# Patient Record
Sex: Female | Born: 1975 | Race: White | Hispanic: No | Marital: Married | Smoking: Never smoker
Health system: Southern US, Community
[De-identification: ages and names within clinical notes are randomized; demographics above are authoritative.]

## PROBLEM LIST (undated history)

## (undated) DIAGNOSIS — F329 Major depressive disorder, single episode, unspecified: Secondary | ICD-10-CM

## (undated) DIAGNOSIS — K219 Gastro-esophageal reflux disease without esophagitis: Secondary | ICD-10-CM

## (undated) DIAGNOSIS — H209 Unspecified iridocyclitis: Secondary | ICD-10-CM

## (undated) DIAGNOSIS — R51 Headache: Secondary | ICD-10-CM

## (undated) DIAGNOSIS — H469 Unspecified optic neuritis: Secondary | ICD-10-CM

## (undated) DIAGNOSIS — F3289 Other specified depressive episodes: Secondary | ICD-10-CM

## (undated) DIAGNOSIS — J309 Allergic rhinitis, unspecified: Secondary | ICD-10-CM

## (undated) HISTORY — DX: Unspecified iridocyclitis: H20.9

## (undated) HISTORY — DX: Allergic rhinitis, unspecified: J30.9

## (undated) HISTORY — DX: Headache: R51

## (undated) HISTORY — DX: Other specified depressive episodes: F32.89

## (undated) HISTORY — DX: Gastro-esophageal reflux disease without esophagitis: K21.9

## (undated) HISTORY — DX: Major depressive disorder, single episode, unspecified: F32.9

## (undated) HISTORY — DX: Unspecified optic neuritis: H46.9

---
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
## 2004-03-21 ENCOUNTER — Encounter: Payer: Self-pay | Admitting: Internal Medicine

## 2004-03-21 ENCOUNTER — Encounter: Admission: RE | Admit: 2004-03-21 | Discharge: 2004-03-21 | Payer: Self-pay | Admitting: Emergency Medicine

## 2004-08-17 ENCOUNTER — Encounter: Admission: RE | Admit: 2004-08-17 | Discharge: 2004-08-17 | Payer: Self-pay | Admitting: Emergency Medicine

## 2004-08-17 ENCOUNTER — Encounter: Payer: Self-pay | Admitting: Internal Medicine

## 2004-10-09 ENCOUNTER — Encounter: Payer: Self-pay | Admitting: Internal Medicine

## 2004-10-09 ENCOUNTER — Emergency Department (HOSPITAL_COMMUNITY): Admission: EM | Admit: 2004-10-09 | Discharge: 2004-10-09 | Payer: Self-pay | Admitting: Emergency Medicine

## 2005-02-12 ENCOUNTER — Ambulatory Visit (HOSPITAL_COMMUNITY): Admission: RE | Admit: 2005-02-12 | Discharge: 2005-02-12 | Payer: Self-pay | Admitting: Gastroenterology

## 2005-02-12 ENCOUNTER — Encounter (INDEPENDENT_AMBULATORY_CARE_PROVIDER_SITE_OTHER): Payer: Self-pay | Admitting: *Deleted

## 2005-05-08 ENCOUNTER — Encounter: Admission: RE | Admit: 2005-05-08 | Discharge: 2005-05-08 | Payer: Self-pay | Admitting: Emergency Medicine

## 2007-09-04 LAB — CONVERTED CEMR LAB
ALT: 16 units/L
AST: 18 units/L
Albumin: 4.5 g/dL
Alkaline Phosphatase: 68 units/L
BUN: 16 mg/dL
Basophils Relative: 1 %
CO2: 22 meq/L
Calcium: 9.4 mg/dL
Chloride: 105 meq/L
Cholesterol: 164 mg/dL
Creatinine, Ser: 0.87 mg/dL
Eosinophils Relative: 1 %
Glucose, Bld: 93 mg/dL
HCT: 38 %
HDL: 43 mg/dL
Hemoglobin: 13 g/dL
LDL Cholesterol: 111 mg/dL
Lymphocytes, automated: 18 %
MCV: 80 fL
Monocytes Relative: 7 %
Neutrophils Relative %: 73 %
Platelets: 237 10*3/uL
Potassium: 4.1 meq/L
RBC: 4.72 M/uL
RDW: 12.2 %
Sodium: 143 meq/L
TSH: 1.542 microintl units/mL
Total Bilirubin: 0.4 mg/dL
Total Protein: 7.4 g/dL
Triglyceride fasting, serum: 51 mg/dL
WBC: 8.2 10*3/uL

## 2008-04-14 ENCOUNTER — Encounter: Admission: RE | Admit: 2008-04-14 | Discharge: 2008-04-14 | Payer: Self-pay | Admitting: Emergency Medicine

## 2008-06-01 ENCOUNTER — Encounter: Admission: RE | Admit: 2008-06-01 | Discharge: 2008-06-01 | Payer: Self-pay | Admitting: Emergency Medicine

## 2009-01-23 ENCOUNTER — Inpatient Hospital Stay (HOSPITAL_COMMUNITY): Admission: AD | Admit: 2009-01-23 | Discharge: 2009-01-23 | Payer: Self-pay | Admitting: Obstetrics & Gynecology

## 2009-01-26 ENCOUNTER — Inpatient Hospital Stay (HOSPITAL_COMMUNITY): Admission: AD | Admit: 2009-01-26 | Discharge: 2009-01-26 | Payer: Self-pay | Admitting: Obstetrics & Gynecology

## 2009-02-03 ENCOUNTER — Inpatient Hospital Stay (HOSPITAL_COMMUNITY): Admission: AD | Admit: 2009-02-03 | Discharge: 2009-02-03 | Payer: Self-pay | Admitting: Obstetrics and Gynecology

## 2009-03-28 LAB — CONVERTED CEMR LAB: Pap Smear: NEGATIVE

## 2009-08-30 ENCOUNTER — Ambulatory Visit: Payer: Self-pay | Admitting: Internal Medicine

## 2009-08-30 DIAGNOSIS — I10 Essential (primary) hypertension: Secondary | ICD-10-CM | POA: Insufficient documentation

## 2009-08-30 DIAGNOSIS — R51 Headache: Secondary | ICD-10-CM

## 2009-08-30 DIAGNOSIS — K219 Gastro-esophageal reflux disease without esophagitis: Secondary | ICD-10-CM

## 2009-08-30 DIAGNOSIS — R519 Headache, unspecified: Secondary | ICD-10-CM | POA: Insufficient documentation

## 2009-08-30 DIAGNOSIS — J309 Allergic rhinitis, unspecified: Secondary | ICD-10-CM | POA: Insufficient documentation

## 2009-08-30 DIAGNOSIS — F329 Major depressive disorder, single episode, unspecified: Secondary | ICD-10-CM

## 2009-08-30 DIAGNOSIS — J45909 Unspecified asthma, uncomplicated: Secondary | ICD-10-CM | POA: Insufficient documentation

## 2009-09-01 ENCOUNTER — Ambulatory Visit: Payer: Self-pay | Admitting: Internal Medicine

## 2009-09-02 LAB — CONVERTED CEMR LAB
ALT: 14 units/L (ref 0–35)
AST: 17 units/L (ref 0–37)
Albumin: 4 g/dL (ref 3.5–5.2)
Alkaline Phosphatase: 47 units/L (ref 39–117)
BUN: 13 mg/dL (ref 6–23)
Basophils Absolute: 0 10*3/uL (ref 0.0–0.1)
Basophils Relative: 0.5 % (ref 0.0–3.0)
Bilirubin Urine: NEGATIVE
Bilirubin, Direct: 0.1 mg/dL (ref 0.0–0.3)
CO2: 27 meq/L (ref 19–32)
Calcium: 9.1 mg/dL (ref 8.4–10.5)
Chloride: 107 meq/L (ref 96–112)
Cholesterol: 162 mg/dL (ref 0–200)
Creatinine, Ser: 0.7 mg/dL (ref 0.4–1.2)
Eosinophils Absolute: 0.1 10*3/uL (ref 0.0–0.7)
Eosinophils Relative: 1 % (ref 0.0–5.0)
GFR calc non Af Amer: 101.79 mL/min (ref >60)
Glucose, Bld: 98 mg/dL (ref 70–99)
HCT: 37 % (ref 36.0–46.0)
HDL: 44.5 mg/dL (ref >39.00)
Hemoglobin, Urine: NEGATIVE
Hemoglobin: 12.7 g/dL (ref 12.0–15.0)
Ketones, ur: NEGATIVE mg/dL
LDL Cholesterol: 108 mg/dL — ABNORMAL HIGH (ref 0–99)
Leukocytes, UA: NEGATIVE
Lymphocytes Relative: 19.9 % (ref 12.0–46.0)
Lymphs Abs: 1.4 10*3/uL (ref 0.7–4.0)
MCHC: 34.4 g/dL (ref 30.0–36.0)
MCV: 85.4 fL (ref 78.0–100.0)
Monocytes Absolute: 0.5 10*3/uL (ref 0.1–1.0)
Monocytes Relative: 6.9 % (ref 3.0–12.0)
Neutro Abs: 5.2 10*3/uL (ref 1.4–7.7)
Neutrophils Relative %: 71.7 % (ref 43.0–77.0)
Nitrite: NEGATIVE
Platelets: 214 10*3/uL (ref 150.0–400.0)
Potassium: 4.2 meq/L (ref 3.5–5.1)
RBC: 4.34 M/uL (ref 3.87–5.11)
RDW: 12.4 % (ref 11.5–14.6)
Sodium: 140 meq/L (ref 135–145)
Specific Gravity, Urine: >=1.03 (ref 1.000–1.030)
TSH: 2.76 microintl units/mL (ref 0.35–5.50)
Total Bilirubin: 0.7 mg/dL (ref 0.3–1.2)
Total CHOL/HDL Ratio: 4
Total Protein, Urine: NEGATIVE mg/dL
Total Protein: 7 g/dL (ref 6.0–8.3)
Triglycerides: 46 mg/dL (ref 0.0–149.0)
Urine Glucose: NEGATIVE mg/dL
Urobilinogen, UA: 0.2 (ref 0.0–1.0)
VLDL: 9.2 mg/dL (ref 0.0–40.0)
WBC: 7.2 10*3/uL (ref 4.5–10.5)
pH: 5 (ref 5.0–8.0)

## 2009-10-26 ENCOUNTER — Ambulatory Visit: Payer: Self-pay | Admitting: Internal Medicine

## 2009-11-18 ENCOUNTER — Encounter: Payer: Self-pay | Admitting: Internal Medicine

## 2009-11-30 ENCOUNTER — Telehealth: Payer: Self-pay | Admitting: Internal Medicine

## 2009-12-05 ENCOUNTER — Ambulatory Visit: Payer: Self-pay | Admitting: Internal Medicine

## 2009-12-05 DIAGNOSIS — H209 Unspecified iridocyclitis: Secondary | ICD-10-CM | POA: Insufficient documentation

## 2009-12-05 LAB — CONVERTED CEMR LAB
Anti Nuclear Antibody(ANA): NEGATIVE
Basophils Absolute: 0.1 10*3/uL (ref 0.0–0.1)
Basophils Relative: 2.1 % (ref 0.0–3.0)
Eosinophils Absolute: 0.1 10*3/uL (ref 0.0–0.7)
Eosinophils Relative: 1 % (ref 0.0–5.0)
HCT: 36 % (ref 36.0–46.0)
Hemoglobin: 12.5 g/dL (ref 12.0–15.0)
IgE (Immunoglobulin E), Serum: 10.6 intl units/mL (ref 0.0–180.0)
Lymphocytes Relative: 23 % (ref 12.0–46.0)
Lymphs Abs: 1.6 10*3/uL (ref 0.7–4.0)
MCHC: 34.8 g/dL (ref 30.0–36.0)
MCV: 85.1 fL (ref 78.0–100.0)
Monocytes Absolute: 0.4 10*3/uL (ref 0.1–1.0)
Monocytes Relative: 6.1 % (ref 3.0–12.0)
Neutro Abs: 4.7 10*3/uL (ref 1.4–7.7)
Neutrophils Relative %: 67.8 % (ref 43.0–77.0)
Platelets: 173 10*3/uL (ref 150.0–400.0)
RBC: 4.23 M/uL (ref 3.87–5.11)
RDW: 12.6 % (ref 11.5–14.6)
Sed Rate: 6 mm/hr (ref 0–22)
WBC: 6.9 10*3/uL (ref 4.5–10.5)

## 2009-12-14 ENCOUNTER — Telehealth: Payer: Self-pay | Admitting: Internal Medicine

## 2009-12-19 ENCOUNTER — Encounter: Payer: Self-pay | Admitting: Internal Medicine

## 2010-01-03 ENCOUNTER — Telehealth: Payer: Self-pay | Admitting: Internal Medicine

## 2010-01-03 DIAGNOSIS — H469 Unspecified optic neuritis: Secondary | ICD-10-CM | POA: Insufficient documentation

## 2010-01-06 ENCOUNTER — Ambulatory Visit (HOSPITAL_COMMUNITY): Admission: RE | Admit: 2010-01-06 | Discharge: 2010-01-06 | Payer: Self-pay | Admitting: Internal Medicine

## 2010-01-06 ENCOUNTER — Telehealth: Payer: Self-pay | Admitting: Internal Medicine

## 2010-04-20 ENCOUNTER — Encounter: Payer: Self-pay | Admitting: Internal Medicine

## 2010-05-28 ENCOUNTER — Encounter: Payer: Self-pay | Admitting: Emergency Medicine

## 2010-06-06 NOTE — Progress Notes (Signed)
Summary: MRI Results - no MS   Phone Note Call from Patient Call back at [phone]   Caller: Patient Reason for Call: Lab or Test Results Summary of Call: PT had MRI done today. Pt would like copy of report sent to Dr. Eustaquio Maize at [facility] and would like callback with results when received at number listed above  Initial call taken by: Brenton Grills MA,  January 06, 2010 4:34 PM  Follow-up for Phone Call        MRI brain normal - no MS - i will call pt with these results Follow-up by: Newt Lukes MD,  January 10, 2010 7:01 AM  Additional Follow-up for Phone Call Additional follow up Details #1::        Dr Elgie Congo office called requesting copy of MRI results. Printed and faxed to [phone] [facility] Additional Follow-up by: Margaret Pyle, CMA,  January 10, 2010 9:33 AM    Additional Follow-up for Phone Call Additional follow up Details #2::    ok - thanks - i also called and LMOM (pt's cell) re: no MS findings - Newt Lukes MD  January 10, 2010 10:27 AM

## 2010-06-06 NOTE — Progress Notes (Signed)
Summary: Lab results  Phone Note Call from Patient Call back at Work Phone [phone]   Caller: Patient [phone] Summary of Call: Pt called requesting remainer of lab results. Initial call taken by: Margaret Pyle, CMA,  December 14, 2009 11:00 AM  Follow-up for Phone Call        all tests returned negative - copies of results were faxed to dr. Deirdre Evener office on 12/09/09 - thanks Follow-up by: Newt Lukes MD,  December 14, 2009 11:03 AM  Additional Follow-up for Phone Call Additional follow up Details #1::        pt informed Additional Follow-up by: Margaret Pyle, CMA,  December 14, 2009 11:10 AM

## 2010-06-06 NOTE — Assessment & Plan Note (Signed)
Summary: NEW / Ezequiel Essex Natale Milch  #   Vital Signs:  Patient profile:   35 year old female Height:      62.5 inches (158.75 cm) Weight:      159.0 pounds (72.27 kg) BMI:     28.72 O2 Sat:      98 % on Room air Temp:     99.1 degrees F (37.28 degrees C) oral Pulse rate:   78 / minute BP sitting:   112 / 78  (left arm) Cuff size:   regular  Vitals Entered By: Orlan Leavens (August 30, 2009 2:01 PM)  O2 Flow:  Room air CC: New patient Is Patient Diabetic? No Pain Assessment Patient in pain? no        Primary Care Provider:  Newt Lukes  CC:  New patient.  History of Present Illness: new pt to me and our practice, here to est care - prev followed with dr. Lorenz Coaster  also patient is here today for annual physical. Patient feels well and has no complaints.  not fasting but will return for labs in AM  Preventive Screening-Counseling & Management  Alcohol-Tobacco     Alcohol drinks/day: <1     Alcohol Counseling: not indicated; use of alcohol is not excessive or problematic     Smoking Status: never     Tobacco Counseling: not indicated; no tobacco use  Caffeine-Diet-Exercise     Diet Counseling: to improve diet; diet is suboptimal     Does Patient Exercise: yes     Type of exercise: walking/treadmill     Times/week: 5     Exercise Counseling: not indicated; exercise is adequate     Depression Counseling: not indicated; screening negative for depression  Safety-Violence-Falls     Seat Belt Use: yes     Seat Belt Counseling: not indicated; patient wears seat belts     Helmet Counseling: not indicated; patient wears helmet when riding bicycle/motocycle     Firearms in the Home: firearms in the home     Firearm Counseling: not indicated; uses recommended firearm safety measures     Smoke Detectors: yes     Smoke Detector Counseling: n/a     Violence Counseling: not indicated; no violence risk noted     Fall Risk Counseling: not indicated; no significant falls  noted  Clinical Review Panels:  Prevention   Last Pap Smear:  Interpretation/Result:Negative for intraepithelial Lesion or Malignancy.    (03/28/2009)  Immunizations   Last Tetanus Booster:  Historical (12/02/2001)  Lipid Management   Cholesterol:  164 (09/04/2007)   LDL (bad choesterol):  111 (09/04/2007)   HDL (good cholesterol):  43 (09/04/2007)   Triglycerides:  51 (09/04/2007)  CBC   WBC:  8.2 (09/04/2007)   RBC:  4.72 (09/04/2007)   Hgb:  13.0 (09/04/2007)   Hct:  38.0 (09/04/2007)   Platelets:  237 (09/04/2007)   MCV  80 (09/04/2007)   RDW  12.2 (09/04/2007)   PMN:  73 (09/04/2007)   Monos:  7 (09/04/2007)   Eosinophils:  1 (09/04/2007)   Basophil:  1 (09/04/2007)  Complete Metabolic Panel   Glucose:  93 (09/04/2007)   Sodium:  143 (09/04/2007)   Potassium:  4.1 (09/04/2007)   Chloride:  105 (09/04/2007)   CO2:  22 (09/04/2007)   BUN:  16 (09/04/2007)   Creatinine:  0.87 (09/04/2007)   Albumin:  4.5 (09/04/2007)   Total Protein:  7.4 (09/04/2007)   Calcium:  9.4 (09/04/2007)   Total Bili:  0.4 (09/04/2007)   Alk Phos:  68 (09/04/2007)   SGPT (ALT):  16 (09/04/2007)   SGOT (AST):  18 (09/04/2007)   -  Date:  09/04/2007    WBC: 8.2    HGB: 13.0    HCT: 38.0    RBC: 4.72    PLT: 237    MCV: 80    RDW: 12.2    Neutrophil: 73    Lymphs: 18    Monos: 7    Eos: 1    Basophil: 1    BG Random: 93    BUN: 16    Creatinine: 0.87    Sodium: 143    Potassium: 4.1    Chloride: 105    CO2 Total: 22    SGOT (AST): 18    SGPT (ALT): 16    T. Bilirubin: 0.4    Alk Phos: 68    Calcium: 9.4    Total Protein: 7.4    Albumin: 4.5    Cholesterol: 164    LDL: 111    HDL: 43    Triglycerides: 51    TSH: 1.542  Current Medications (verified): 1)  Multivitamins  Tabs (Multiple Vitamin) .... Take 1 By Mouth Qd 2)  Calcium 500 Mg Tabs (Calcium) .... Take 1 By Mouth Qd  Allergies (verified): No Known Drug Allergies  Past History:  Past Medical  History: Allergic rhinitis Asthma Depression GERD Hypertension, hx  MD rooster: gyn - tavvon derm - [place]  Past Surgical History: (L) breast tissue expander (1993) (L) breast impant (1995)  Family History: Family History Breast cancer 1st degree relative <50 (other relative) Family History Diabetes 1st degree relative (parent,grandparent) Family History High cholesterol (parent) Family History Hypertension (parent) Blood clots (grandparent) - pGF expired age 64y d/t clot in heart  mom with DM and CVAx2 in 2010 s/p CEA  Social History: Never Smoked rare alcohol - works as Engineer, 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 I have reviewed all other systems and they were negative.   Physical Exam  General:  alert, well-developed, well-nourished, and cooperative to examination.    Eyes:  vision grossly intact; pupils equal, round and reactive to light.  conjunctiva and lids normal.    Ears:  normal pinnae bilaterally, without erythema, swelling, or tenderness to palpation. TMs clear, without effusion, or cerumen impaction. Hearing grossly normal bilaterally  Mouth:  teeth and gums in good repair; mucous membranes moist, without lesions or ulcers. oropharynx clear without exudate, no erythema.  Neck:  supple, full ROM, no masses, no thyromegaly; no thyroid nodules or tenderness. no JVD or carotid bruits.   Lungs:  normal respiratory effort, no intercostal retractions or use of accessory muscles; normal breath sounds bilaterally - no crackles and no wheezes.    Heart:  normal rate, regular rhythm, no murmur, and no rub. BLE without edema. Abdomen:  soft, non-tender, normal bowel sounds, no distention; no masses and no appreciable hepatomegaly or splenomegaly.   Genitalia:  defer to gyn Msk:  No deformity or scoliosis noted of thoracic  or lumbar spine.   Neurologic:  alert & oriented X3 and cranial nerves II-XII symetrically intact.  strength normal in all extremities, sensation intact to light touch, and gait normal. speech fluent without dysarthria or aphasia; follows commands with good comprehension.  Skin:  recent sunburn, but no other rashes, vesicles, ulcers, or erythema. No nodules or irregularity to palpation.  Psych:  Oriented X3, memory intact for recent and remote, normally interactive, good eye contact, not anxious appearing, not depressed appearing, and not agitated.      Impression & Recommendations:  Problem # 1:  PREVENTIVE HEALTH CARE (ICD-V70.0) Patient has been counseled on age-appropriate routine health concerns for screening and prevention. These are reviewed and up-to-date. Immunizations are up-to-date or declined. Labs ordered and will be reviewed.   Complete Medication List: 1)  Multivitamins Tabs (Multiple vitamin) .... Take 1 by mouth qd 2)  Calcium 500 Mg Tabs (Calcium) .... Take 1 by mouth qd 3)  Lorazepam 1 Mg Tabs (Lorazepam) .... 1/2-1 tab by mouth every 12hours as needed for stress and anxiety  Patient Instructions: 1)  it was good to see you today. 2)  prior history and labs reviewed today -  3)  test(s) ordered today for fasting physical labwork to include CBC, Bmet, LFTs, TSH, cholesterol and UA - your results will be posted on the phone tree for review in 48-72 hours from the time of test completion; call [phone] and enter your 9 digit MRN (listed above on this page, just below your name); if any changes need to be made or there are abnormal results, you will be contacted directly.  4)  refill on generic Ativan as requested today  5)  it is important that you continue to work on losing weight - monitor your diet as you are doing and consume fewer calories such as less carbohydrates (sugar) and less fat.  if referral to nutritionist is desired, let me know and i will make a referral as  needed  6)  Please schedule a follow-up appointment annually for medical physical and labs, may call sooner if problems.  Prescriptions: LORAZEPAM 1 MG TABS (LORAZEPAM) 1/2-1 tab by mouth every 12hours as needed for stress and anxiety  #30 x 3   Entered and Authorized by:   Newt Lukes MD   Signed by:   Newt Lukes MD on 08/30/2009   Method used:   Print then Give to Patient   RxID:   [id number]    Immunization History:  Tetanus/Td Immunization History:    Tetanus/Td:  historical (12/02/2001)  Hepatitis B Immunization History:    Hepatitis B # 1:  historical done @ dr. Lorenz Coaster office (09/16/1998)    Hepatitis B # 2:  historical done @ dr. Lorenz Coaster office (11/18/1998)    Hepatitis B # 3:  historical done @ dr. Lorenz Coaster office (04/04/1999)  MMR Immunization History:    MMR # 1:  historical done @ dr. Lorenz Coaster office (11/24/1993)    Pap Smear  Procedure date:  03/28/2009  Findings:      Interpretation/Result:Negative for intraepithelial Lesion or Malignancy.     MRI EXAM  Procedure date:  03/28/2009  Findings:      Exam Type: MRI of the left shoulder w/o contrast Impression; Subacrominal/subdeltoid brusitis without evidence of underlying cuff pathology   MISC. Report  Procedure date:  08/17/2004  Findings:      Type of Report: (L) breast ultrasound Impression; Normal right ultrasound with saline implant in place.  Negative-BI-RADS 1

## 2010-06-06 NOTE — Assessment & Plan Note (Signed)
Summary: F/U PER DR Leana Springston/LMB   Vital Signs:  Patient profile:   35 year old female Height:      62.5 inches (158.75 cm) Weight:      158.12 pounds (71.87 kg) O2 Sat:      97 % on Room air Temp:     98.9 degrees F (37.17 degrees C) oral Pulse rate:   84 / minute BP sitting:   100 / 62  (left arm) Cuff size:   regular  Vitals Entered By: Orlan Leavens RMA (December 05, 2009 8:59 AM)  O2 Flow:  Room air CC: follow-up visit Is Patient Diabetic? No Pain Assessment Patient in pain? no        Primary Care Provider:  Newt Lukes  CC:  follow-up visit.  History of Present Illness: 10/26/09 OV reviewed: c/o eye pain left side onset yesterday afternoon, progressive pain in eye globe itself and pressure in surrounding tissue - redness and excess tearing on left side but no matting while sleeping last night - no fever - no trauma or recent outdoor work -  +blurring vision this AM from left side - no preceeding ear or sinus symptoms  - no allergy flares or sick contacts no prior hx eye problems seen by optho = dx acute uvetitis - tx for same -  since that time, recurrent episodes - also affecting right side - but more pain and problems in left (initial symptoms) seeing optho for same on ongoing basis -  here for systemic eval -  Current Medications (verified): 1)  Multivitamins  Tabs (Multiple Vitamin) .... Take 1 By Mouth Qd 2)  Calcium 500 Mg Tabs (Calcium) .... Take 1 By Mouth Qd 3)  Lorazepam 1 Mg Tabs (Lorazepam) .... 1/2-1 Tab By Mouth Every 12hours As Needed For Stress and Anxiety 4)  Prednisolone Acetate 1 % Susp (Prednisolone Acetate) .... Use 1 Drop in Both Eyes Three Times A Day 5)  Atropine Sulfate 1 % Soln (Atropine Sulfate) .... 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         also, see HPI above. I have reviewed all other systems and they were negative.   Physical Exam  General:  alert, well-developed, well-nourished, and cooperative to examination.    Lungs:  normal respiratory effort, no intercostal retractions or use of accessory muscles; normal breath sounds bilaterally - no crackles and no wheezes.    Heart:  normal rate, regular rhythm, no murmur, and no rub. BLE without edema. 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 despite prompt optho tx as ongoing - eval for underlying systemic dz - see labs below - syphilisi, TB, sarcoid, SpA, LE, sjogrens reports HIV neg in 2003 and decline repeat test  today (no known risks) f/u depending on these results - ?refer to rhuem as needed  Orders: T-Antinuclear Antib (ANA) [phone]) T-RPR (Syphilis) [phone]) T-Syphilis Antibody, IgM,ELISA (Quantitative) [phone]) T-Interior Allergy Panel ([phone]) T-HLA B27 ([id number]) T-Lyme Disease ([id number]) T-2 View CXR (71020TC) TLB-CBC Platelet - w/Differential (85025-CBCD) TLB-Sedimentation Rate (ESR) (85652-ESR)  Complete Medication List: 1)  Multivitamins Tabs (Multiple vitamin) .... Take 1 by mouth qd 2)  Calcium 500 Mg Tabs (Calcium) .... Take 1 by mouth qd 3)  Lorazepam 1 Mg Tabs (Lorazepam) .... 1/2-1 tab  by mouth every 12hours as needed for stress and anxiety 4)  Prednisolone Acetate 1 % Susp (Prednisolone acetate) .... Use 1 drop in both eyes three times a day 5)  Atropine Sulfate 1 % Soln (Atropine sulfate) .... Use 1 drop at bedtime od  Other Orders: TB Skin Test [phone]) Admin 1st Vaccine (25956)  Patient Instructions: 1)  it was good to see you today. 2)  test(s) ordered today - your results will be called to you  3)  PPD today - return Wed 12/07/09 to read 4)  Please schedule a follow-up appointment as needed if problems (or as previously scheduled)    Immunizations Administered:  PPD Skin Test:    Vaccine Type: PPD    Site: left forearm    Mfr: Sanofi Pasteur    Dose: 0.1 ml    Route: ID    Given by: Orlan Leavens RMA    Exp. Date: 10/02/2011    Lot #: [id number]  PPD Results    Date of reading: 12/07/2009    Results: < 5mm    Interpretation: negative

## 2010-06-06 NOTE — Progress Notes (Signed)
Summary: labs  Phone Note Outgoing Call   Call placed by: Orlan Leavens RMA,  November 30, 2009 11:04 AM Call placed to: Patient Summary of Call: Md recieved fax from Dr. [facility]mer [facility] requesting md to order addtional labs. MD wanted me to contact pt to let her know she will need ov to arrange these testing. Called pt no ansew LMOM RTC Initial call taken by: Orlan Leavens RMA,  November 30, 2009 11:05 AM  Follow-up for Phone Call        Pt return call back schedule appt for [facility] 12/05/09 @ 9:00am Follow-up by: Orlan Leavens RMA,  November 30, 2009 3:18 PM

## 2010-06-06 NOTE — Progress Notes (Signed)
Summary: [facility] - dx optic neuritis, ?MS  Phone Note From Other Clinic   Caller: dr. Eustaquio Maize ([facility] specialist) Call For: doctor Summary of Call: i spoke with dr. Eustaquio Maize, retinal specialist at [facility], who eval pt today for uveitis which has been rresistant to treatment - no specific med dz identified on prior systemic eval as per dr. Elmer Picker and myself mid july - now with evidence marked right side optic neuritis and ongoing uveitis - he is recommending eval for poss MS - i will order MRI brain to eval same as would change implications for med mgmt of optic dz Initial call taken by: Newt Lukes MD,  January 03, 2010 2:15 PM  New Problems: UNSPECIFIED OPTIC NEURITIS (ICD-377.30)   New Problems: UNSPECIFIED OPTIC NEURITIS (ICD-377.30)

## 2010-06-06 NOTE — Consult Note (Signed)
Summary: [name]  [name]   Imported By: Sherian Rein 11/03/2009 15:59:06  _____________________________________________________________________  External Attachment:    Type:   Image     Comment:   External Document

## 2010-06-06 NOTE — Consult Note (Signed)
Summary: [name]  [facility]   Imported By: Sherian Rein 12/28/2009 14:09:07  _____________________________________________________________________  External Attachment:    Type:   Image     Comment:   External Document

## 2010-06-06 NOTE — Letter (Signed)
Summary: [facility]  [facility]   Imported By: Sherian Rein 12/06/2009 11:51:39  _____________________________________________________________________  External Attachment:    Type:   Image     Comment:   External Document

## 2010-06-06 NOTE — Assessment & Plan Note (Signed)
Summary: PAINFUL RED SWOLLEN EYE  STC   Vital Signs:  Patient profile:   35 year old female Height:      62.5 inches (158.75 cm) Weight:      156.8 pounds (71.27 kg) O2 Sat:      98 % on Room air Temp:     99.3 degrees F (37.39 degrees C) oral Pulse rate:   72 / minute BP sitting:   110 / 70  (left arm) Cuff size:   regular  Vitals Entered By: Orlan Leavens (October 26, 2009 8:58 AM)  O2 Flow:  Room air CC: Swollen, Red, (L) eye Is Patient Diabetic? No Pain Assessment Patient in pain? no        Primary Care Provider:  Newt Lukes  CC:  Swollen, Red, and (L) eye.  History of Present Illness: c/o eye pain left side onset yesterday afternoon, progressive pain in eye globe itself and pressure in surrounding tissue - redness and excess tearing on left side but no matting while sleeping last night - no fever - no trauma or recent outdoor work -  +blurring vision this AM from left side - no preceeding ear or sinus symptoms  - no allergy flares or sick contacts no prior hx eye problems  Current Medications (verified): 1)  Multivitamins  Tabs (Multiple Vitamin) .... Take 1 By Mouth Qd 2)  Calcium 500 Mg Tabs (Calcium) .... Take 1 By Mouth Qd 3)  Lorazepam 1 Mg Tabs (Lorazepam) .... 1/2-1 Tab By Mouth Every 12hours As Needed For Stress and Anxiety  Allergies (verified): No Known Drug Allergies  Past History:  Past Medical History: Allergic rhinitis Asthma Depression GERD Hypertension, hx  MD roster: gyn - [facility] derm - [place] [facility] - [place]  Review of Systems  The patient denies fever, decreased hearing, hoarseness, peripheral edema, headaches, and suspicious skin lesions.    Physical Exam  General:  alert, well-developed, well-nourished, and cooperative to examination.    Head:  mild periorbital edema left eye Eyes:  diffuse conjunctivitis left eye with ++tearing but no purulent discharge - no obvious corneal ulceration or discoloration - reports "blurring"  left side but bilateral visual field testing grossly normal - PERRL - right side normal Ears:  normal pinnae bilaterally, without erythema, swelling, or tenderness to palpation. TMs clear, without effusion, or cerumen impaction. Hearing grossly normal bilaterally  Mouth:  teeth and gums in good repair; mucous membranes moist, without lesions or ulcers. oropharynx clear without exudate, no erythema.  Neurologic:  alert & oriented X3 and cranial nerves symetrically intact.  strength normal in all extremities, sensation intact to light touch, and gait normal. speech fluent without dysarthria or aphasia; follows commands with good comprehension.    Impression & Recommendations:  Problem # 1:  EYE PAIN, LEFT (ICD-379.91) may represent conjunctivitis but given progressive eye pain and subjective vision changes, refer for urgent optho eval today - no hx trauma or foreign body exposure - +allg hx but no recent flares -  Orders: Ophthalmology Referral (Ophthalmology)  Complete Medication List: 1)  Multivitamins Tabs (Multiple vitamin) .... Take 1 by mouth qd 2)  Calcium 500 Mg Tabs (Calcium) .... Take 1 by mouth qd 3)  Lorazepam 1 Mg Tabs (Lorazepam) .... 1/2-1 tab by mouth every 12hours as needed for stress and anxiety  Patient Instructions: 1)  it was good to see you today. 2)  refer for eval by eye specialist today becuase of pain associated with your eye redness and tearing -

## 2010-06-08 NOTE — Letter (Signed)
Summary: 11/18/09 letter/[facility]  [facility]   Imported By: Lester [place] 04/26/2010 09:23:05  _____________________________________________________________________  External Attachment:    Type:   Image     Comment:   External Document

## 2010-08-11 LAB — CBC
HCT: 39 % (ref 36.0–46.0)
Hemoglobin: 13.3 g/dL (ref 12.0–15.0)
MCHC: 34.1 g/dL (ref 30.0–36.0)
MCV: 83.8 fL (ref 78.0–100.0)
Platelets: 224 10*3/uL (ref 150–400)
RBC: 4.65 MIL/uL (ref 3.87–5.11)
RDW: 11.9 % (ref 11.5–15.5)
WBC: 9.9 10*3/uL (ref 4.0–10.5)

## 2010-08-11 LAB — ABO/RH: ABO/RH(D): A POS

## 2010-08-11 LAB — URINALYSIS, ROUTINE W REFLEX MICROSCOPIC
Bilirubin Urine: NEGATIVE
Glucose, UA: NEGATIVE mg/dL
Ketones, ur: NEGATIVE mg/dL
Leukocytes, UA: NEGATIVE
Nitrite: NEGATIVE
Protein, ur: NEGATIVE mg/dL
Specific Gravity, Urine: 1.015 (ref 1.005–1.030)
Urobilinogen, UA: 0.2 mg/dL (ref 0.0–1.0)
pH: 5.5 (ref 5.0–8.0)

## 2010-08-11 LAB — WET PREP, GENITAL
Clue Cells Wet Prep HPF POC: NONE SEEN
Yeast Wet Prep HPF POC: NONE SEEN

## 2010-08-11 LAB — URINE MICROSCOPIC-ADD ON

## 2010-08-11 LAB — GC/CHLAMYDIA PROBE AMP, GENITAL
Chlamydia, DNA Probe: NEGATIVE
GC Probe Amp, Genital: NEGATIVE

## 2010-08-11 LAB — POCT PREGNANCY, URINE: Preg Test, Ur: POSITIVE

## 2010-08-11 LAB — HCG, QUANTITATIVE, PREGNANCY: hCG, Beta Chain, Quant, S: 1297 m[IU]/mL — ABNORMAL HIGH (ref <5)

## 2010-09-22 NOTE — Op Note (Signed)
[name], [name]                ACCOUNT NO.:  [id number]   MEDICAL RECORD NO.:  [id number]          PATIENT TYPE:  AMB   LOCATION:  ENDO                         FACILITY:  [facility]   PHYSICIAN:  Bernette Redbird, M.D.   DATE OF BIRTH:  1975/05/22   DATE OF PROCEDURE:  02/12/2005  DATE OF DISCHARGE:                                 OPERATIVE REPORT   PROCEDURE:  Upper endoscopy with biopsies.   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   FINDINGS:  Possible slight reflux laryngitis. Spontaneously reducing - small  hiatal hernia.   DESCRIPTION OF PROCEDURE:  The nature, purpose, and risks of the procedure  have been discussed with the patient who provided written consent. Sedation  was fentanyl 100 mcg and Versed 10 mg IV without arrhythmias or  desaturation, resulting in mild to moderate sedation.   The Olympus small-caliber adult video endoscope was passed under direct  vision.   The larynx and vocal cords were mostly seen and there appeared to be some  very slight chronic inflammatory change, characterized by thickening of the  posterior commissure and perhaps some slight edema of the arytenoid  cartilages. There were no dramatic changes of reflux, however.   The esophagus was readily entered. The esophageal mucosa was normal. There  was no evidence of free reflux nor any evidence of reflux esophagitis,  Barrett's esophagus, varices, infection, neoplasia or any ring or stricture.  The Z-line was identified right at the diaphragmatic level on the way in  with the scope, although during retroflexion in the stomach, a small hiatal  hernia was seen, suggesting that the patient does have a small sliding  spontaneously reducing hiatal hernia. The LES itself appeared somewhat lax.   The stomach contained no  significant residual and had normal mucosa without  evidence of gastritis, erosions, ulcers, polyps or masses including a  retroflexed view of the cardia, and the pylorus, duodenal bulb and second  duodenum looked normal. Duodenal biopsies were obtained to help exclude  celiac disease as a cause for the patient's diarrhea.   The patient tolerated the procedure well and there were no apparent  complications.   IMPRESSION:  1.  Reflux, without overt adverse sequelae.  2.  Possible slight reflux laryngitis which might correlate with the      patient's symptoms of frequent throat clearing  3.  Small intermittent hiatal hernia.  4.  Duodenal biopsies obtained because of history of recurrent diarrhea.   PLAN:  Await pathology results and, for now, continue current medical  therapy. Consider possibly increasing the Nexium empirically. Note that the  patient does have a history of asthma which could be another manifestation  of LPR.           ______________________________  Bernette Redbird, M.D.     RB/MEDQ  D:  02/12/2005  T:  02/12/2005  Job:  [id number]   cc:   Reuben Likes, M.D.  Fax: [phone]

## 2010-09-26 ENCOUNTER — Encounter: Payer: Self-pay | Admitting: Internal Medicine

## 2010-09-29 ENCOUNTER — Encounter: Payer: Self-pay | Admitting: Internal Medicine

## 2010-12-07 ENCOUNTER — Encounter: Payer: Self-pay | Admitting: Internal Medicine

## 2011-04-10 ENCOUNTER — Encounter (HOSPITAL_COMMUNITY): Payer: Self-pay | Admitting: Anesthesiology

## 2011-04-10 ENCOUNTER — Encounter (HOSPITAL_COMMUNITY)
Admission: AD | Disposition: A | Discharge status: Home / Self Care | Payer: Self-pay | Source: Ambulatory Visit | Attending: Obstetrics and Gynecology

## 2011-04-10 ENCOUNTER — Encounter (HOSPITAL_COMMUNITY): Payer: Self-pay

## 2011-04-10 ENCOUNTER — Encounter (HOSPITAL_COMMUNITY): Payer: Self-pay | Admitting: Obstetrics

## 2011-04-10 ENCOUNTER — Inpatient Hospital Stay (HOSPITAL_COMMUNITY): Payer: BC Managed Care – PPO | Admitting: Anesthesiology

## 2011-04-10 ENCOUNTER — Inpatient Hospital Stay (HOSPITAL_COMMUNITY)
Admission: AD | Admit: 2011-04-10 | Discharge: 2011-04-13 | DRG: 370 | Disposition: A | Discharge status: Home / Self Care | Payer: BC Managed Care – PPO | Source: Ambulatory Visit | Attending: Obstetrics and Gynecology | Admitting: Obstetrics and Gynecology

## 2011-04-10 DIAGNOSIS — H469 Unspecified optic neuritis: Secondary | ICD-10-CM

## 2011-04-10 DIAGNOSIS — O9903 Anemia complicating the puerperium: Secondary | ICD-10-CM | POA: Diagnosis not present

## 2011-04-10 DIAGNOSIS — O411090 Infection of amniotic sac and membranes, unspecified, unspecified trimester, not applicable or unspecified: Secondary | ICD-10-CM | POA: Diagnosis present

## 2011-04-10 DIAGNOSIS — J45909 Unspecified asthma, uncomplicated: Secondary | ICD-10-CM

## 2011-04-10 DIAGNOSIS — F329 Major depressive disorder, single episode, unspecified: Secondary | ICD-10-CM

## 2011-04-10 DIAGNOSIS — J309 Allergic rhinitis, unspecified: Secondary | ICD-10-CM

## 2011-04-10 DIAGNOSIS — D62 Acute posthemorrhagic anemia: Secondary | ICD-10-CM | POA: Diagnosis not present

## 2011-04-10 DIAGNOSIS — H209 Unspecified iridocyclitis: Secondary | ICD-10-CM

## 2011-04-10 DIAGNOSIS — K219 Gastro-esophageal reflux disease without esophagitis: Secondary | ICD-10-CM

## 2011-04-10 DIAGNOSIS — O09529 Supervision of elderly multigravida, unspecified trimester: Secondary | ICD-10-CM | POA: Diagnosis present

## 2011-04-10 DIAGNOSIS — R51 Headache: Secondary | ICD-10-CM

## 2011-04-10 DIAGNOSIS — O324XX0 Maternal care for high head at term, not applicable or unspecified: Secondary | ICD-10-CM | POA: Diagnosis present

## 2011-04-10 DIAGNOSIS — IMO0001 Reserved for inherently not codable concepts without codable children: Secondary | ICD-10-CM

## 2011-04-10 DIAGNOSIS — I10 Essential (primary) hypertension: Secondary | ICD-10-CM

## 2011-04-10 LAB — CBC
HCT: 32.4 % — ABNORMAL LOW (ref 36.0–46.0)
MCHC: 33 g/dL (ref 30.0–36.0)
MCV: 79 fL (ref 78.0–100.0)
RDW: 13 % (ref 11.5–15.5)
WBC: 12.6 10*3/uL — ABNORMAL HIGH (ref 4.0–10.5)

## 2011-04-10 LAB — STREP B DNA PROBE: GBS: NEGATIVE

## 2011-04-10 LAB — ANTIBODY SCREEN: Antibody Screen: NEGATIVE

## 2011-04-10 LAB — RUBELLA ANTIBODY, IGM: Rubella: IMMUNE

## 2011-04-10 LAB — HEPATITIS B SURFACE ANTIGEN: Hepatitis B Surface Ag: NEGATIVE

## 2011-04-10 SURGERY — Surgical Case
Anesthesia: Regional | Wound class: Clean Contaminated

## 2011-04-10 MED ORDER — PHENYLEPHRINE 40 MCG/ML (10ML) SYRINGE FOR IV PUSH (FOR BLOOD PRESSURE SUPPORT): 80.0000 ug | PREFILLED_SYRINGE | INTRAVENOUS | Status: DC | PRN

## 2011-04-10 MED ORDER — DEXTROSE 5 % IV SOLN
2.0000 g | Freq: Once | INTRAVENOUS | Status: AC
  Administered 2011-04-10: 2 g via INTRAVENOUS
  Filled 2011-04-10: qty 2

## 2011-04-10 MED ORDER — SCOPOLAMINE 1 MG/3DAYS TD PT72
1.0000 | MEDICATED_PATCH | Freq: Once | TRANSDERMAL | Status: DC
  Administered 2011-04-10: 1.5 mg via TRANSDERMAL

## 2011-04-10 MED ORDER — ONDANSETRON HCL 4 MG/2ML IJ SOLN
INTRAMUSCULAR | Status: AC
  Filled 2011-04-10: qty 2

## 2011-04-10 MED ORDER — LIDOCAINE HCL 1.5 % IJ SOLN
INTRAMUSCULAR | Status: DC | PRN
  Administered 2011-04-10: 3 mL via EPIDURAL
  Administered 2011-04-10: 4 mL via EPIDURAL

## 2011-04-10 MED ORDER — PHENYLEPHRINE HCL 10 MG/ML IJ SOLN
INTRAMUSCULAR | Status: DC | PRN
  Administered 2011-04-10 (×2): 40 ug via INTRAVENOUS

## 2011-04-10 MED ORDER — KETOROLAC TROMETHAMINE 60 MG/2ML IM SOLN
INTRAMUSCULAR | Status: AC
  Filled 2011-04-10: qty 2

## 2011-04-10 MED ORDER — SODIUM BICARBONATE 8.4 % IV SOLN
INTRAVENOUS | Status: AC
  Filled 2011-04-10: qty 50

## 2011-04-10 MED ORDER — BUPIVACAINE HCL (PF) 0.25 % IJ SOLN
INTRAMUSCULAR | Status: DC | PRN
  Administered 2011-04-10: 10 mL

## 2011-04-10 MED ORDER — PHENYLEPHRINE 40 MCG/ML (10ML) SYRINGE FOR IV PUSH (FOR BLOOD PRESSURE SUPPORT)
80.0000 ug | PREFILLED_SYRINGE | INTRAVENOUS | Status: DC | PRN
  Filled 2011-04-10: qty 5

## 2011-04-10 MED ORDER — KETOROLAC TROMETHAMINE 60 MG/2ML IM SOLN
60.0000 mg | Freq: Once | INTRAMUSCULAR | Status: AC | PRN
  Administered 2011-04-10: 60 mg via INTRAMUSCULAR

## 2011-04-10 MED ORDER — CITRIC ACID-SODIUM CITRATE 334-500 MG/5ML PO SOLN
30.0000 mL | ORAL | Status: DC | PRN
  Administered 2011-04-10: 30 mL via ORAL
  Filled 2011-04-10: qty 15

## 2011-04-10 MED ORDER — MEPERIDINE HCL 25 MG/ML IJ SOLN: 6.2500 mg | INTRAMUSCULAR | Status: DC | PRN

## 2011-04-10 MED ORDER — LIDOCAINE-EPINEPHRINE (PF) 2 %-1:200000 IJ SOLN
INTRAMUSCULAR | Status: AC
  Filled 2011-04-10: qty 20

## 2011-04-10 MED ORDER — EPHEDRINE 5 MG/ML INJ
10.0000 mg | INTRAVENOUS | Status: DC | PRN
  Filled 2011-04-10: qty 4

## 2011-04-10 MED ORDER — OXYCODONE-ACETAMINOPHEN 5-325 MG PO TABS: 2.0000 | ORAL_TABLET | ORAL | Status: DC | PRN

## 2011-04-10 MED ORDER — OXYTOCIN BOLUS FROM INFUSION
500.0000 mL | Freq: Once | INTRAVENOUS | Status: DC
  Filled 2011-04-10: qty 500

## 2011-04-10 MED ORDER — EPHEDRINE 5 MG/ML INJ: 10.0000 mg | INTRAVENOUS | Status: DC | PRN

## 2011-04-10 MED ORDER — OXYTOCIN 10 UNIT/ML IJ SOLN
INTRAMUSCULAR | Status: AC
  Filled 2011-04-10: qty 4

## 2011-04-10 MED ORDER — DIPHENHYDRAMINE HCL 50 MG/ML IJ SOLN: 12.5000 mg | INTRAMUSCULAR | Status: DC | PRN

## 2011-04-10 MED ORDER — OXYTOCIN 20 UNITS IN LACTATED RINGERS INFUSION - SIMPLE
INTRAVENOUS | Status: DC | PRN
  Administered 2011-04-10: 40 [IU] via INTRAVENOUS

## 2011-04-10 MED ORDER — LACTATED RINGERS IV SOLN
500.0000 mL | INTRAVENOUS | Status: DC | PRN
Start: 2011-04-10 — End: 2011-04-10

## 2011-04-10 MED ORDER — FLEET ENEMA 7-19 GM/118ML RE ENEM: 1.0000 | ENEMA | RECTAL | Status: DC | PRN

## 2011-04-10 MED ORDER — OXYTOCIN 20 UNITS IN LACTATED RINGERS INFUSION - SIMPLE: 125.0000 mL/h | Freq: Once | INTRAVENOUS | Status: DC

## 2011-04-10 MED ORDER — TERBUTALINE SULFATE 1 MG/ML IJ SOLN: 0.2500 mg | Freq: Once | INTRAMUSCULAR | Status: DC | PRN

## 2011-04-10 MED ORDER — IBUPROFEN 600 MG PO TABS: 600.0000 mg | ORAL_TABLET | Freq: Four times a day (QID) | ORAL | Status: DC | PRN

## 2011-04-10 MED ORDER — SCOPOLAMINE 1 MG/3DAYS TD PT72
MEDICATED_PATCH | TRANSDERMAL | Status: AC
  Filled 2011-04-10: qty 1

## 2011-04-10 MED ORDER — MORPHINE SULFATE (PF) 0.5 MG/ML IJ SOLN
INTRAMUSCULAR | Status: DC | PRN
  Administered 2011-04-10: 2 mg via INTRAVENOUS

## 2011-04-10 MED ORDER — DEXTROSE 5 % IV SOLN: 2.0000 g | Freq: Two times a day (BID) | INTRAVENOUS | Status: DC

## 2011-04-10 MED ORDER — ACETAMINOPHEN 325 MG PO TABS: 650.0000 mg | ORAL_TABLET | ORAL | Status: DC | PRN

## 2011-04-10 MED ORDER — FENTANYL CITRATE 0.05 MG/ML IJ SOLN: 25.0000 ug | INTRAMUSCULAR | Status: DC | PRN

## 2011-04-10 MED ORDER — BUTORPHANOL TARTRATE 2 MG/ML IJ SOLN
2.0000 mg | Freq: Once | INTRAMUSCULAR | Status: AC
Start: 2011-04-10 — End: 2011-04-10
  Administered 2011-04-10: 2 mg via INTRAVENOUS
  Filled 2011-04-10 (×2): qty 1

## 2011-04-10 MED ORDER — LACTATED RINGERS IV SOLN
500.0000 mL | Freq: Once | INTRAVENOUS | Status: AC
  Administered 2011-04-10: 21:00:00 via INTRAVENOUS
  Administered 2011-04-10: 1000 mL via INTRAVENOUS

## 2011-04-10 MED ORDER — MORPHINE SULFATE (PF) 0.5 MG/ML IJ SOLN
INTRAMUSCULAR | Status: DC | PRN
  Administered 2011-04-10: 3 mg via EPIDURAL

## 2011-04-10 MED ORDER — FENTANYL 2.5 MCG/ML BUPIVACAINE 1/10 % EPIDURAL INFUSION (WH - ANES)
INTRAMUSCULAR | Status: DC | PRN
  Administered 2011-04-10: 14 mL/h via EPIDURAL

## 2011-04-10 MED ORDER — SODIUM BICARBONATE 8.4 % IV SOLN
INTRAVENOUS | Status: DC | PRN
  Administered 2011-04-10: 5 mL via EPIDURAL

## 2011-04-10 MED ORDER — PHENYLEPHRINE 40 MCG/ML (10ML) SYRINGE FOR IV PUSH (FOR BLOOD PRESSURE SUPPORT)
PREFILLED_SYRINGE | INTRAVENOUS | Status: AC
  Filled 2011-04-10: qty 5

## 2011-04-10 MED ORDER — FENTANYL 2.5 MCG/ML BUPIVACAINE 1/10 % EPIDURAL INFUSION (WH - ANES)
14.0000 mL/h | INTRAMUSCULAR | Status: DC
  Administered 2011-04-10 (×2): 14 mL/h via EPIDURAL
  Filled 2011-04-10 (×3): qty 60

## 2011-04-10 MED ORDER — FENTANYL CITRATE 0.05 MG/ML IJ SOLN
INTRAMUSCULAR | Status: AC
  Filled 2011-04-10: qty 2

## 2011-04-10 MED ORDER — LIDOCAINE HCL (PF) 1 % IJ SOLN
30.0000 mL | INTRAMUSCULAR | Status: DC | PRN
  Filled 2011-04-10: qty 30

## 2011-04-10 MED ORDER — ONDANSETRON HCL 4 MG/2ML IJ SOLN
4.0000 mg | Freq: Four times a day (QID) | INTRAMUSCULAR | Status: DC | PRN
  Administered 2011-04-10: 4 mg via INTRAVENOUS
  Filled 2011-04-10: qty 2

## 2011-04-10 MED ORDER — LACTATED RINGERS IV SOLN
INTRAVENOUS | Status: DC
  Administered 2011-04-10: 14:00:00 via INTRAVENOUS
  Administered 2011-04-10: 125 mL/h via INTRAVENOUS
  Administered 2011-04-10: 20:00:00 via INTRAVENOUS

## 2011-04-10 MED ORDER — LACTATED RINGERS IV SOLN
INTRAVENOUS | Status: DC | PRN
  Administered 2011-04-10: 21:00:00 via INTRAVENOUS

## 2011-04-10 MED ORDER — ONDANSETRON HCL 4 MG/2ML IJ SOLN
INTRAMUSCULAR | Status: DC | PRN
  Administered 2011-04-10: 4 mg via INTRAVENOUS

## 2011-04-10 MED ORDER — MORPHINE SULFATE 0.5 MG/ML IJ SOLN
INTRAMUSCULAR | Status: AC
  Filled 2011-04-10: qty 10

## 2011-04-10 MED ORDER — OXYTOCIN 20 UNITS IN LACTATED RINGERS INFUSION - SIMPLE
1.0000 m[IU]/min | INTRAVENOUS | Status: DC
  Administered 2011-04-10: 6 m[IU]/min via INTRAVENOUS
  Administered 2011-04-10: 2 m[IU]/min via INTRAVENOUS
  Filled 2011-04-10: qty 1000

## 2011-04-10 SURGICAL SUPPLY — 28 items
CLOTH BEACON ORANGE TIMEOUT ST (SAFETY) ×2 IMPLANT
CONTAINER PREFILL 10% NBF 15ML (MISCELLANEOUS) IMPLANT
DRESSING TELFA 8X3 (GAUZE/BANDAGES/DRESSINGS) IMPLANT
ELECT REM PT RETURN 9FT ADLT (ELECTROSURGICAL) ×2
ELECTRODE REM PT RTRN 9FT ADLT (ELECTROSURGICAL) ×1 IMPLANT
EXTRACTOR VACUUM M CUP 4 TUBE (SUCTIONS) IMPLANT
GAUZE SPONGE 4X4 12PLY STRL LF (GAUZE/BANDAGES/DRESSINGS) ×4 IMPLANT
GLOVE BIO SURGEON STRL SZ7.5 (GLOVE) ×4 IMPLANT
GOWN PREVENTION PLUS LG XLONG (DISPOSABLE) ×4 IMPLANT
GOWN PREVENTION PLUS XLARGE (GOWN DISPOSABLE) ×2 IMPLANT
KIT ABG SYR 3ML LUER SLIP (SYRINGE) IMPLANT
NEEDLE HYPO 25X1 1.5 SAFETY (NEEDLE) ×2 IMPLANT
NEEDLE HYPO 25X5/8 SAFETYGLIDE (NEEDLE) IMPLANT
NS IRRIG 1000ML POUR BTL (IV SOLUTION) ×2 IMPLANT
PACK C SECTION WH (CUSTOM PROCEDURE TRAY) ×2 IMPLANT
PAD ABD 7.5X8 STRL (GAUZE/BANDAGES/DRESSINGS) IMPLANT
SLEEVE SCD COMPRESS KNEE MED (MISCELLANEOUS) IMPLANT
STAPLER VISISTAT 35W (STAPLE) ×2 IMPLANT
SUT MNCRL 0 VIOLET CTX 36 (SUTURE) ×2 IMPLANT
SUT MON AB 2-0 CT1 27 (SUTURE) ×2 IMPLANT
SUT MON AB-0 CT1 36 (SUTURE) ×4 IMPLANT
SUT MONOCRYL 0 CTX 36 (SUTURE) ×2
SUT PLAIN 0 NONE (SUTURE) IMPLANT
SUT PLAIN 2 0 XLH (SUTURE) IMPLANT
SYR CONTROL 10ML LL (SYRINGE) ×2 IMPLANT
TOWEL OR 17X24 6PK STRL BLUE (TOWEL DISPOSABLE) ×4 IMPLANT
TRAY FOLEY CATH 14FR (SET/KITS/TRAYS/PACK) ×2 IMPLANT
WATER STERILE IRR 1000ML POUR (IV SOLUTION) ×2 IMPLANT

## 2011-04-10 NOTE — Addendum Note (Signed)
Addendum  created 04/10/11 2301 by Joshalyn Ancheta L. Rodman Pickle, MD   Modules edited:Orders, PRL Based Order Sets

## 2011-04-10 NOTE — Progress Notes (Signed)
Awaiting the OR 

## 2011-04-10 NOTE — Progress Notes (Signed)
[name] is a 35 y.o. G2P0010 at [redacted]w[redacted]d by LMP admitted for rupture of membranes and labor  Subjective: Pushing x 3 hours with no descent  Objective: BP 111/64  Pulse 137  Temp(Src) 99.1 F (37.3 C) (Oral)  Resp 18  Ht 5\' 2"  (1.575 m)  Wt 82.555 kg (182 lb)  BMI 33.29 kg/m2  SpO2 100%      FHT:  FHR: 155 bpm, variability: moderate,  accelerations:  Present,  decelerations:  Absent UC:   regular, every 2 minutes (adequate by IUPC) SVE:   Dilation: 10 Effacement (%): 100 Station: +1 Exam by:: m wilkins rnc  Labs: Lab Results  Component Value Date   WBC 12.6* 04/10/2011   HGB 10.7* 04/10/2011   HCT 32.4* 04/10/2011   MCV 79.0 04/10/2011   PLT 238 04/10/2011    Assessment / Plan: Arrest of decent  Labor: failure of descent Preeclampsia:  na Fetal Wellbeing:  Category I Pain Control:  Epidural I/D:  n/a Anticipated MOD:  Proceed with cesarean section. Risks vs benefits discussed with patient. Consent done.  Carling Liberman J 04/10/2011, 8:45 PM

## 2011-04-10 NOTE — Progress Notes (Signed)
[name] is a 35 y.o. G2P0010 at [redacted]w[redacted]d by LMP admitted for rupture of membranes  Subjective:Uncomfortable. Getting epidural.   Objective: BP 138/70  Pulse 99  Temp(Src) 97.6 F (36.4 C) (Oral)  Resp 18  Ht 5\' 2"  (1.575 m)  Wt 82.555 kg (182 lb)  BMI 33.29 kg/m2      FHT:  FHR: 155 bpm, variability: moderate,  accelerations:  Present,  decelerations:  Absent UC:   regular, every 2-3 minutes SVE:   Dilation: 3 Effacement (%): 80 Station: 0 Exam by:: m wilkins rnc  Labs: Lab Results  Component Value Date   WBC 12.6* 04/10/2011   HGB 10.7* 04/10/2011   HCT 32.4* 04/10/2011   MCV 79.0 04/10/2011   PLT 238 04/10/2011    Assessment / Plan: Augmentation of labor, progressing well  Labor: Progressing normally Preeclampsia:  na Fetal Wellbeing:  Category I Pain Control:  Epidural I/D:  n/a Anticipated MOD:  NSVD  Altan Kraai J 04/10/2011, 1:50 PM

## 2011-04-10 NOTE — Anesthesia Procedure Notes (Signed)
Epidural Patient location during procedure: OB Start time: 04/10/2011 1:38 PM  Staffing Anesthesiologist: Masin Shatto A. Performed by: anesthesiologist   Preanesthetic Checklist Completed: patient identified, site marked, surgical consent, pre-op evaluation, timeout performed, IV checked, risks and benefits discussed and monitors and equipment checked  Epidural Patient position: sitting Prep: site prepped and draped and DuraPrep Patient monitoring: continuous pulse ox and blood pressure Approach: midline Injection technique: LOR air  Needle:  Needle type: Tuohy  Needle gauge: 17 G Needle length: 9 cm Needle insertion depth: 5 cm cm Catheter type: closed end flexible Catheter size: 19 Gauge Catheter at skin depth: 10 cm Test dose: negative and 1.5% lidocaine  Assessment Events: blood not aspirated, injection not painful, no injection resistance, negative IV test and no paresthesia  Additional Notes Patient is more comfortable after epidural dosed. Please see RN's note for documentation of vital signs and FHR which are stable.

## 2011-04-10 NOTE — Op Note (Signed)
Cesarean Section Procedure Note  Indications: chorioamnionitis and failure to progress: arrest of descent  Pre-operative Diagnosis: 38 week 6 day pregnancy.  Post-operative Diagnosis: same  Surgeon: Lenoard Aden   Assistants: Mody  Anesthesia: Epidural anesthesia  ASA Class: 2  Procedure Details  The patient was seen in the Holding Room. The risks, benefits, complications, treatment options, and expected outcomes were discussed with the patient.  The patient concurred with the proposed plan, giving informed consent. The risks of anesthesia, infection, bleeding and possible injury to other organs discussed. Injury to bowel, bladder, or ureter with possible need for repair discussed. Possible need for transfusion with secondary risks of hepatitis or HIV acquisition discussed. Post operative complications to include but not limited to DVT, PE and Pneumonia noted. The site of surgery properly noted/marked. The patient was taken to Operating Room # 1, identified as [name] and the procedure verified as C-Section Delivery. A Time Out was held and the above information confirmed.  After induction of anesthesia, the patient was draped and prepped in the usual sterile manner. A Pfannenstiel incision was made and carried down through the subcutaneous tissue to the fascia. Fascial incision was made and extended transversely using Mayo scissors. The fascia was separated from the underlying rectus tissue superiorly and inferiorly. The peritoneum was identified and entered. Peritoneal incision was extended longitudinally. The utero-vesical peritoneal reflection was incised transversely and the bladder flap was bluntly freed from the lower uterine segment. A low transverse uterine incision(Kerr hysterotomy) was made. Delivered from OP presentation was a  female with Apgar scores of 8 at one minute and 9 at five minutes. After the umbilical cord was clamped and cut cord blood was obtained for evaluation.  The placenta was removed intact and appeared normal. The uterine outline, tubes and ovaries appeared normal except for questionable adenomyosis in right fundal area.. The uterine incision was closed with running locked sutures of 0 Monocryl x 2 layers. Interrupted sutures for hemostasis.Hemostasis was observed. Lavage was carried out until clear. The fascia was then reapproximated with running sutures of 0 Monocryl.  Plain suture to close the subcutaneous layer. The skin was reapproximated with staples.  Instrument, sponge, and needle counts were correct prior the abdominal closure and at the conclusion of the case.   Findings: As above  Estimated Blood Loss:  800         Drains: foley                 Specimens: placenta                 Complications:  None; patient tolerated the procedure well.         Disposition: PACU - hemodynamically stable.         Condition: stable  Attending Attestation: I performed the procedure.

## 2011-04-10 NOTE — Anesthesia Preprocedure Evaluation (Signed)
Anesthesia Evaluation  Patient identified by MRN, date of birth, ID band Patient awake    Reviewed: Allergy & Precautions, H&P , NPO status , Patient's Chart, lab work & pertinent test results  Airway Mallampati: III TM Distance: >3 FB Neck ROM: full    Dental No notable dental hx. (+) Teeth Intact   Pulmonary neg pulmonary ROS, asthma ,  clear to auscultation  Pulmonary exam normal       Cardiovascular neg cardio ROS regular Normal    Neuro/Psych Negative Neurological ROS  Negative Psych ROS   GI/Hepatic negative GI ROS, Neg liver ROS, Medicated and Controlled,  Endo/Other  Negative Endocrine ROS  Renal/GU negative Renal ROS  Genitourinary negative   Musculoskeletal   Abdominal   Peds  Hematology negative hematology ROS (+)   Anesthesia Other Findings   Reproductive/Obstetrics (+) Pregnancy                           Anesthesia Physical Anesthesia Plan  ASA: II  Anesthesia Plan: Epidural   Post-op Pain Management:    Induction:   Airway Management Planned:   Additional Equipment:   Intra-op Plan:   Post-operative Plan:   Informed Consent: I have reviewed the patients History and Physical, chart, labs and discussed the procedure including the risks, benefits and alternatives for the proposed anesthesia with the patient or authorized representative who has indicated his/her understanding and acceptance.     Plan Discussed with: Anesthesiologist  Anesthesia Plan Comments:         Anesthesia Quick Evaluation

## 2011-04-10 NOTE — H&P (Signed)
[name], [name]                ACCOUNT NO.:  [id number]  MEDICAL RECORD NO.:  [id number]  LOCATION:  9161                          FACILITY:  [facility]  PHYSICIAN:  Lenoard Aden, M.D.DATE OF BIRTH:  July 07, 1975  DATE OF ADMISSION:  04/10/2011 DATE OF DISCHARGE:                             HISTORY & PHYSICAL   CHIEF COMPLAINT:  Spontaneous rupture of membranes at 2 a.m.  HISTORY OF PRESENT ILLNESS:  She is a 35 year old white female, G2, P0, at 38-6/7 weeks with spontaneous rupture of membranes.  GBS negative.  PAST MEDICAL HISTORY:  She has a past medical history remarkable for gastroesophageal reflux, herpes zoster, [place] syndrome, abnormal Pap smear.  MEDICATIONS:  Prenatal vitamins.  FAMILY HISTORY:  Psychiatric disease, stroke, hypertension, heart disease, diabetes.  She has a previous history of an SAB in 2010.  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  Prenatal course has been otherwise uncomplicated.  PHYSICAL EXAM:  GENERAL:  She is a well-developed, well-nourished white female, in no acute distress. HEENT:  Normal. NECK:  Supple.  Full range of motion. LUNGS:  Clear. HEART:  Regular rhythm. ABDOMEN:  Soft, gravid, nontender.  Estimated fetal weight 7.5-8 pounds. Cervix is 2 cm, 60%, vertex, -1, clear fluid. EXTREMITIES:  There were no cords. NEUROLOGIC:  Nonfocal. SKIN:  Intact.  IMPRESSION:  Term intrauterine pregnancy with spontaneous rupture of membranes.  GBS negative.  PLAN:  Pitocin augmentation.  Epidural as needed.  Anticipate attempts at vaginal delivery.    Lenoard Aden, M.D.    RJT/MEDQ  D:  04/10/2011  T:  04/10/2011  Job:  [id number]

## 2011-04-10 NOTE — Progress Notes (Signed)
[name] is a 35 y.o. G2P0010 at [redacted]w[redacted]d by  LMP admitted for rupture of membranes with labor  Subjective: Pushing x  Objective: BP 89/59  Pulse 122  Temp(Src) 98.7 F (37.1 C) (Oral)  Resp 18  Ht 5\' 2"  (1.575 m)  Wt 82.555 kg (182 lb)  BMI 33.29 kg/m2  SpO2 100%      FHT:  FHR: 150-160 bpm, variability: moderate,  accelerations:  Present,  decelerations:  Absent UC:   regular, every 2 minutes SVE:   Dilation: 10 Effacement (%): 100 Station: +1 Exam by:: m wilkins rnc  Labs: Lab Results  Component Value Date   WBC 12.6* 04/10/2011   HGB 10.7* 04/10/2011   HCT 32.4* 04/10/2011   MCV 79.0 04/10/2011   PLT 238 04/10/2011    Assessment / Plan: Augmentation of labor, progressing well now second stage  Labor: Progressing normally Preeclampsia:  na Fetal Wellbeing:  Category I Pain Control:  Epidural I/D:  n/a Anticipated MOD:  NSVD  [name] J 04/10/2011, 6:04 PM

## 2011-04-10 NOTE — Progress Notes (Signed)
[name] is a 35 y.o. G2P0010 at [redacted]w[redacted]d by LMP admitted for rupture of membranes  Subjective:   Objective: BP 112/71  Pulse 90  Temp(Src) 99 F (37.2 C) (Oral)  Resp 18  Ht 5\' 2"  (1.575 m)  Wt 82.555 kg (182 lb)  BMI 33.29 kg/m2      FHT:  FHR: 150 bpm, variability: moderate,  accelerations:  Present,  decelerations:  Absent UC:   none SVE:   Dilation: 1 Effacement (%): Thick Station: -3 Exam by:: C Penley RN  Labs: Lab Results  Component Value Date   WBC 12.6* 04/10/2011   HGB 10.7* 04/10/2011   HCT 32.4* 04/10/2011   MCV 79.0 04/10/2011   PLT 238 04/10/2011    Assessment / Plan: Augmentation of labor, progressing well- will start Pitocin  Labor: start augmentation for SROM at term Preeclampsia:  na Fetal Wellbeing:  Category I Pain Control:  Labor support without medications I/D:  n/a Anticipated MOD:  NSVD  Fallon Howerter J 04/10/2011, 8:31 AM

## 2011-04-10 NOTE — Transfer of Care (Signed)
Immediate Anesthesia Transfer of Care Note  Patient: [name]  Procedure(s) Performed:  CESAREAN SECTION - Primary Cesarean Section with birth of baby boy @ 2121  Patient Location: PACU  Anesthesia Type: Epidural  Level of Consciousness: alert  and oriented  Airway & Oxygen Therapy: Patient Spontanous Breathing  Post-op Assessment: Report given to PACU RN and Post -op Vital signs reviewed and stable  Post vital signs: stable  Complications: No apparent anesthesia complications

## 2011-04-10 NOTE — Anesthesia Postprocedure Evaluation (Signed)
Anesthesia Post Note  Patient: [name]  Procedure(s) Performed:  CESAREAN SECTION - Primary Cesarean Section with birth of baby boy @ 2121  Anesthesia type: Epidural  Patient location: PACU  Post pain: Pain level controlled  Post assessment: Post-op Vital signs reviewed  Last Vitals:  Filed Vitals:   04/10/11 2245  BP: 110/56  Pulse: 85  Temp:   Resp: 18    Post vital signs: stable  Level of consciousness: awake  Complications: No apparent anesthesia complications

## 2011-04-10 NOTE — Progress Notes (Signed)
Pt with c/o leaking clear fluid this am

## 2011-04-11 ENCOUNTER — Encounter (HOSPITAL_COMMUNITY): Payer: Self-pay | Admitting: *Deleted

## 2011-04-11 LAB — CBC
HCT: 26.4 % — ABNORMAL LOW (ref 36.0–46.0)
Hemoglobin: 8.6 g/dL — ABNORMAL LOW (ref 12.0–15.0)
MCH: 26.1 pg (ref 26.0–34.0)
MCHC: 32.6 g/dL (ref 30.0–36.0)
MCV: 80.2 fL (ref 78.0–100.0)

## 2011-04-11 MED ORDER — DIPHENHYDRAMINE HCL 25 MG PO CAPS: 25.0000 mg | ORAL_CAPSULE | Freq: Four times a day (QID) | ORAL | Status: DC | PRN

## 2011-04-11 MED ORDER — DOCUSATE SODIUM 100 MG PO CAPS
100.0000 mg | ORAL_CAPSULE | Freq: Every day | ORAL | Status: DC
  Administered 2011-04-11 – 2011-04-13 (×3): 100 mg via ORAL
  Filled 2011-04-11 (×3): qty 1

## 2011-04-11 MED ORDER — KETOROLAC TROMETHAMINE 30 MG/ML IJ SOLN: 30.0000 mg | Freq: Four times a day (QID) | INTRAMUSCULAR | Status: AC | PRN

## 2011-04-11 MED ORDER — NALBUPHINE HCL 10 MG/ML IJ SOLN
5.0000 mg | INTRAMUSCULAR | Status: DC | PRN
  Filled 2011-04-11: qty 1

## 2011-04-11 MED ORDER — IBUPROFEN 600 MG PO TABS
600.0000 mg | ORAL_TABLET | Freq: Four times a day (QID) | ORAL | Status: DC | PRN
  Filled 2011-04-11 (×2): qty 1

## 2011-04-11 MED ORDER — MENTHOL 3 MG MT LOZG: 1.0000 | LOZENGE | OROMUCOSAL | Status: DC | PRN

## 2011-04-11 MED ORDER — SODIUM CHLORIDE 0.9 % IJ SOLN: 3.0000 mL | INTRAMUSCULAR | Status: DC | PRN

## 2011-04-11 MED ORDER — TETANUS-DIPHTH-ACELL PERTUSSIS 5-2.5-18.5 LF-MCG/0.5 IM SUSP: 0.5000 mL | Freq: Once | INTRAMUSCULAR | Status: DC

## 2011-04-11 MED ORDER — SIMETHICONE 80 MG PO CHEW: 80.0000 mg | CHEWABLE_TABLET | ORAL | Status: DC | PRN

## 2011-04-11 MED ORDER — DIPHENHYDRAMINE HCL 50 MG/ML IJ SOLN: 25.0000 mg | INTRAMUSCULAR | Status: DC | PRN

## 2011-04-11 MED ORDER — WITCH HAZEL-GLYCERIN EX PADS: 1.0000 "application " | MEDICATED_PAD | CUTANEOUS | Status: DC | PRN

## 2011-04-11 MED ORDER — IBUPROFEN 600 MG PO TABS
600.0000 mg | ORAL_TABLET | Freq: Four times a day (QID) | ORAL | Status: DC
  Administered 2011-04-11 – 2011-04-13 (×9): 600 mg via ORAL
  Filled 2011-04-11 (×7): qty 1

## 2011-04-11 MED ORDER — OXYCODONE-ACETAMINOPHEN 5-325 MG PO TABS
1.0000 | ORAL_TABLET | ORAL | Status: DC | PRN
  Administered 2011-04-11 – 2011-04-12 (×4): 1 via ORAL
  Filled 2011-04-11 (×4): qty 1

## 2011-04-11 MED ORDER — ONDANSETRON HCL 4 MG/2ML IJ SOLN: 4.0000 mg | Freq: Three times a day (TID) | INTRAMUSCULAR | Status: DC | PRN

## 2011-04-11 MED ORDER — DIBUCAINE 1 % RE OINT: 1.0000 "application " | TOPICAL_OINTMENT | RECTAL | Status: DC | PRN

## 2011-04-11 MED ORDER — PRENATAL PLUS 27-1 MG PO TABS
1.0000 | ORAL_TABLET | Freq: Every day | ORAL | Status: DC
  Administered 2011-04-11 – 2011-04-13 (×3): 1 via ORAL
  Filled 2011-04-11 (×3): qty 1

## 2011-04-11 MED ORDER — NALOXONE HCL 0.4 MG/ML IJ SOLN: 0.4000 mg | INTRAMUSCULAR | Status: DC | PRN

## 2011-04-11 MED ORDER — ONDANSETRON HCL 4 MG/2ML IJ SOLN: 4.0000 mg | INTRAMUSCULAR | Status: DC | PRN

## 2011-04-11 MED ORDER — SIMETHICONE 80 MG PO CHEW
80.0000 mg | CHEWABLE_TABLET | Freq: Three times a day (TID) | ORAL | Status: DC
  Administered 2011-04-11 – 2011-04-13 (×9): 80 mg via ORAL

## 2011-04-11 MED ORDER — DIPHENHYDRAMINE HCL 50 MG/ML IJ SOLN: 12.5000 mg | INTRAMUSCULAR | Status: DC | PRN

## 2011-04-11 MED ORDER — METOCLOPRAMIDE HCL 5 MG/ML IJ SOLN: 10.0000 mg | Freq: Three times a day (TID) | INTRAMUSCULAR | Status: DC | PRN

## 2011-04-11 MED ORDER — DIPHENHYDRAMINE HCL 25 MG PO CAPS: 25.0000 mg | ORAL_CAPSULE | ORAL | Status: DC | PRN

## 2011-04-11 MED ORDER — SENNOSIDES-DOCUSATE SODIUM 8.6-50 MG PO TABS
2.0000 | ORAL_TABLET | Freq: Every day | ORAL | Status: DC
  Administered 2011-04-11 – 2011-04-12 (×2): 2 via ORAL

## 2011-04-11 MED ORDER — ONDANSETRON HCL 4 MG PO TABS: 4.0000 mg | ORAL_TABLET | ORAL | Status: DC | PRN

## 2011-04-11 MED ORDER — METHYLERGONOVINE MALEATE 0.2 MG PO TABS: 0.2000 mg | ORAL_TABLET | ORAL | Status: DC | PRN

## 2011-04-11 MED ORDER — OXYTOCIN 20 UNITS IN LACTATED RINGERS INFUSION - SIMPLE
125.0000 mL/h | INTRAVENOUS | Status: AC
  Administered 2011-04-11: 125 mL/h via INTRAVENOUS
  Filled 2011-04-11: qty 1000

## 2011-04-11 MED ORDER — POLYSACCHARIDE IRON 150 MG PO CAPS
150.0000 mg | ORAL_CAPSULE | Freq: Every day | ORAL | Status: DC
  Administered 2011-04-11 – 2011-04-13 (×3): 150 mg via ORAL
  Filled 2011-04-11 (×3): qty 1

## 2011-04-11 MED ORDER — LACTATED RINGERS IV SOLN: INTRAVENOUS | Status: DC

## 2011-04-11 MED ORDER — ZOLPIDEM TARTRATE 5 MG PO TABS: 5.0000 mg | ORAL_TABLET | Freq: Every evening | ORAL | Status: DC | PRN

## 2011-04-11 MED ORDER — LANOLIN HYDROUS EX OINT: 1.0000 "application " | TOPICAL_OINTMENT | CUTANEOUS | Status: DC | PRN

## 2011-04-11 MED ORDER — METHYLERGONOVINE MALEATE 0.2 MG/ML IJ SOLN: 0.2000 mg | INTRAMUSCULAR | Status: DC | PRN

## 2011-04-11 MED ORDER — SODIUM CHLORIDE 0.9 % IV SOLN
1.0000 ug/kg/h | INTRAVENOUS | Status: DC | PRN
  Filled 2011-04-11: qty 2.5

## 2011-04-11 NOTE — Anesthesia Postprocedure Evaluation (Signed)
  Anesthesia Post-op Note  Patient: [name]  Procedure(s) Performed:  CESAREAN SECTION - Primary Cesarean Section with birth of baby boy @ 2121  Patient Location: Mother/Baby  Anesthesia Type: Epidural  Level of Consciousness: alert  and oriented  Airway and Oxygen Therapy: Patient Spontanous Breathing  Post-op Pain: mild  Post-op Assessment: Patient's Cardiovascular Status Stable and Respiratory Function Stable  Post-op Vital Signs: stable  Complications: No apparent anesthesia complications

## 2011-04-11 NOTE — Addendum Note (Signed)
Addendum  created 04/11/11 0757 by Fanny Dance   Modules edited:Notes Section

## 2011-04-11 NOTE — Progress Notes (Signed)
Patient ID: [name], female   DOB: 10/14/1975, 35 y.o.   MRN: [id number]  S:         Reports feeling tired / some pain             Tolerating po intake / no nausea / no vomiting / no flatus / no BM             Bleeding is light             Pain controlled withmotrin and percocet             Up ad lib / ambulatory  Newborn breast feeding  / Circumcision planned   O:  A & O x 3 NAD             VS: Blood pressure 90/65, pulse 80, temperature 98.2 F (36.8 C), temperature source Oral, resp. rate 18, height 5\' 2"  (1.575 m), weight 82.555 kg (182 lb), SpO2 97.00%, unknown if currently breastfeeding.  LABS:  Lab Results  Component Value Date   WBC 22.9* 04/11/2011   HGB 8.6* 04/11/2011   HCT 26.4* 04/11/2011   MCV 80.2 04/11/2011   PLT 193 04/11/2011     I&O: I/O last 3 completed shifts: In: 5215.8 [P.O.:960; I.V.:4205.8; IV Piggyback:50] Out: 1250 [Urine:650; Blood:600]   Total I/O In: -  Out: 600 [Urine:600]  Lungs: Clear and unlabored  Heart: regular rate and rhythm / no mumurs  Abdomen: soft, non-tender, non-distended, hypoactive bowel sounds             Fundus: firm, non-tender, Ueven             Dressing intact without drainage              Perineum: mild edema of vulva  Lochia: light  Extremities: trace edema, no calf pain or tenderness, SCD in place  A:        POD # 1 S/P cesarean section            Acute blood loss anemia  P:        Routine postoperative care              Start iron and colace             Push water hydration             Advance activity - remove dressing in shower later today     [name] 04/11/2011, 9:22 AM

## 2011-04-12 ENCOUNTER — Encounter (HOSPITAL_COMMUNITY): Payer: Self-pay | Admitting: *Deleted

## 2011-04-12 MED ORDER — POLYSACCHARIDE IRON 150 MG PO CAPS: 150.0000 mg | ORAL_CAPSULE | Freq: Every day | ORAL | Status: DC

## 2011-04-12 MED ORDER — DOCUSATE SODIUM 100 MG PO CAPS: 100.0000 mg | ORAL_CAPSULE | Freq: Every day | ORAL | Status: DC

## 2011-04-12 NOTE — Progress Notes (Addendum)
Subjective: POD# 2 Information for the patient's newborn:  Arielle, Eber [[id number]]  female  / circ done  Reports feeling sore, does not like to take narcotics, makes her feel dizzy and sweaty  Feeding: bottle Patient reports tolerating PO.  Breast symptoms: none Pain controlled withmotrin and minimal percocet Denies HA/SOB/C/P/N/V/dizziness. Flatus present. She reports vaginal bleeding as normal, without clots.  She is ambulating, urinating without difficult.     Objective:   VS:  Filed Vitals:   04/11/11 1355 04/11/11 1802 04/11/11 2130 04/12/11 0530  BP: 100/68 111/66 91/58 102/68  Pulse: 76 68 88 77  Temp: 98.5 F (36.9 C) 97.9 F (36.6 C) 98.3 F (36.8 C) 98.2 F (36.8 C)  TempSrc: Oral Oral Oral Oral  Resp: 18 20 18 18   Height:      Weight:      SpO2: 98% 99% 99%     No intake or output data in the 24 hours ending 04/12/11 0935      Basename 04/11/11 0520 04/10/11 0645  WBC 22.9* 12.6*  HGB 8.6* 10.7*  HCT 26.4* 32.4*  PLT 193 238     Blood type:  A pos  Rubella: Immune (12/04 0000)     Physical Exam:  General: alert, cooperative and no distress CV: Regular rate and rhythm Resp: clear Abdomen: soft, nontender, normal bowel sounds Incision: clean, dry, intact and staples in place, mild edema over mons Uterine Fundus: firm, below umbilicus, nontender Lochia: minimal Ext: edema (+) 1 pedal and Homans sign is negative, no sign of DVT      Assessment/Plan: 35 y.o.  status post Cesarean section. POD# 2.  s/p Cesarean Delivery.  Indications: failure to progress                 Doing well, stable.            Ambulate, warm PO fluids to increase gut motility  Routine post-op care  Encouraged take percocet as needed   ABL anemia over chronic IDA  Continue oral Fe and Colace  Anticipate discharge home in AM.   PAUL,DANIELA 04/12/2011, 9:35 AM

## 2011-04-13 ENCOUNTER — Encounter (HOSPITAL_COMMUNITY): Payer: Self-pay | Admitting: *Deleted

## 2011-04-13 MED ORDER — OXYCODONE-ACETAMINOPHEN 5-325 MG PO TABS: 1.0000 | ORAL_TABLET | Freq: Four times a day (QID) | ORAL | Status: AC | PRN

## 2011-04-13 MED ORDER — IBUPROFEN 600 MG PO TABS: 600.0000 mg | ORAL_TABLET | Freq: Four times a day (QID) | ORAL | Status: AC | PRN

## 2011-04-13 MED ORDER — POLYSACCHARIDE IRON 150 MG PO CAPS: 150.0000 mg | ORAL_CAPSULE | Freq: Every day | ORAL | Status: DC

## 2011-04-13 NOTE — Discharge Summary (Signed)
Obstetric Discharge Summary Reason for Admission: onset of labor, rupture of membranes and @ 38w 6d Prenatal Procedures: NST and ultrasound Intrapartum Procedures: cesarean: low cervical, transverse Postpartum Procedures: none Complications-Operative and Postpartum: none Hemoglobin  Date Value Range Status  04/11/2011 8.6* 12.0-15.0 (g/dL) Final     DELTA CHECK NOTED     REPEATED TO VERIFY     HCT  Date Value Range Status  04/11/2011 26.4* 36.0-46.0 (%) Final    Discharge Diagnoses: Term Pregnancy-delivered  Discharge Information: Date: 04/13/2011 Activity: pelvic rest Diet: routine Medications: Ibuprofen, Iron and Percocet Condition: stable Instructions: refer to practice specific booklet Discharge to: home Follow-up Information    Follow up with Lenoard Aden, MD in 6 weeks.   Contact information:   [address] [place] [address] [phone]        Staples to be removed prior to d/c home with benzoin and steri strips applied.   Newborn Data: Live born female on 04/10/11 Birth Weight: 7 lb 11.7 oz (3507 g) APGAR: 9, 9  Home with mother.  [name] 04/13/2011, 9:56 AM

## 2011-04-13 NOTE — Progress Notes (Signed)
Patient ID: [name], female   DOB: 15-Apr-1976, 35 y.o.   MRN: [id number] POD # 3  Subjective: Pt reports feeling well and eager for d/c home/ Pain controlled with prescription NSAID's including motrin and percocet Tolerating po/Voiding without problems/ No n/v/Flatus pos Activity: ad lib Bleeding is spotting Newborn info:  Information for the patient's newborn:  Kerigan, Narvaez [[id number]]  female  / circ complete/ Feeding: bottle   Objective: VS: Blood pressure 106/72, pulse 76, temperature 97.7 F (36.5 C), temperature source Oral, resp. rate 18   Physical Exam:  General: alert, cooperative and no distress CV: Regular rate and rhythm Resp: clear Abdomen: soft, nontender, normal bowel sounds Incision: healing well, no drainage, no erythema, well approximated Uterine Fundus: firm, below umbilicus, nontender Lochia: minimal Ext: edema trace to +1 and Homans sign is negative, no sign of DVT    A/P: POD # 3/ G2P1011 S/P Primary C/S for failure to descend and chorioamnionitis ABL Anemia Doing well and stable for discharge home RX: Percocet 5/325 1 - 2 tabs po every 6 hrs prn pain  #30 No refill Ibuprofen 600mg  po Q 6 hrs prn pain #30 Refill x 1 Niferex 150mg  po QD #30 ref x 1 RT pp visit in 6 wks Staple removal today with application of benzoin and steri strips  SignedArlana Lindau [facility] 04/13/11 1015

## 2011-05-08 DIAGNOSIS — H209 Unspecified iridocyclitis: Secondary | ICD-10-CM

## 2011-05-08 HISTORY — DX: Unspecified iridocyclitis: H20.9

## 2011-08-06 ENCOUNTER — Encounter: Payer: Self-pay | Admitting: Internal Medicine

## 2011-08-06 ENCOUNTER — Ambulatory Visit (INDEPENDENT_AMBULATORY_CARE_PROVIDER_SITE_OTHER): Payer: BC Managed Care – PPO | Admitting: Internal Medicine

## 2011-08-06 ENCOUNTER — Encounter: Payer: Self-pay | Admitting: *Deleted

## 2011-08-06 VITALS — BP 102/88 | HR 79 | Temp 97.9°F | Resp 16 | Ht 62.0 in | Wt 158.0 lb

## 2011-08-06 DIAGNOSIS — H1030 Unspecified acute conjunctivitis, unspecified eye: Secondary | ICD-10-CM

## 2011-08-06 MED ORDER — POLYMYXIN B-TRIMETHOPRIM 10000-0.1 UNIT/ML-% OP SOLN: 1.0000 [drp] | OPHTHALMIC | Status: AC

## 2011-08-06 NOTE — Patient Instructions (Signed)
It was good to see you today. Eye antibiotics and work excuse note -  Your prescription(s) have been submitted to your pharmacy. Please take as directed and contact our office if you believe you are having problem(s) with the medication(s).

## 2011-08-06 NOTE — Progress Notes (Signed)
  Subjective:    Patient ID: [name], female    DOB: 04-16-1976, 36 y.o.   MRN: [id number]  HPI complains of R pink eye Onset 3 days ago New pain in eye or change in vision Exposure to young child with pinkeye over the weekend Associated with matting and discharge, no itching or pain  Past Medical History  Diagnosis Date  . ALLERGIC RHINITIS   . ASTHMA   . DEPRESSION   . GERD   . Headache   . HYPERTENSION   . IRITIS   . UNSPECIFIED OPTIC NEURITIS     Review of Systems  Eyes: Positive for redness. Negative for photophobia, pain and itching.       Objective:   Physical Exam BP 102/88  Pulse 79  Temp(Src) 97.9 F (36.6 C) (Oral)  Resp 16  Ht 5\' 2"  (1.575 m)  Wt 158 lb (71.668 kg)  BMI 28.90 kg/m2  SpO2 97%  LMP 07/23/2011 General: No acute distress Eyes: Right - conjunctivitis, no uveitis. PERRL, painless. EOMI - vision grossly intact bilaterally      Assessment & Plan:   acute conjunctivitis - no evidence of recurrent uveitis/iritis on exam or hx Empiric Polytrim - work excuse note and contact precautions

## 2011-08-14 ENCOUNTER — Other Ambulatory Visit (INDEPENDENT_AMBULATORY_CARE_PROVIDER_SITE_OTHER): Payer: BC Managed Care – PPO

## 2011-08-14 ENCOUNTER — Other Ambulatory Visit: Payer: Self-pay | Admitting: Internal Medicine

## 2011-08-14 DIAGNOSIS — Z0000 Encounter for general adult medical examination without abnormal findings: Secondary | ICD-10-CM

## 2011-08-14 LAB — URINALYSIS, ROUTINE W REFLEX MICROSCOPIC
Hgb urine dipstick: NEGATIVE
Ketones, ur: NEGATIVE
Urine Glucose: NEGATIVE
Urobilinogen, UA: 0.2 (ref 0.0–1.0)

## 2011-08-14 LAB — BASIC METABOLIC PANEL
Calcium: 8.8 mg/dL (ref 8.4–10.5)
GFR: 83.85 mL/min (ref >60.00)
Glucose, Bld: 87 mg/dL (ref 70–99)
Sodium: 140 mEq/L (ref 135–145)

## 2011-08-14 LAB — CBC WITH DIFFERENTIAL/PLATELET
Eosinophils Absolute: 0 10*3/uL (ref 0.0–0.7)
Eosinophils Relative: 0.6 % (ref 0.0–5.0)
HCT: 37.6 % (ref 36.0–46.0)
Lymphs Abs: 1.6 10*3/uL (ref 0.7–4.0)
MCHC: 33.4 g/dL (ref 30.0–36.0)
MCV: 79.6 fl (ref 78.0–100.0)
Monocytes Absolute: 0.4 10*3/uL (ref 0.1–1.0)
Platelets: 233 10*3/uL (ref 150.0–400.0)
RDW: 13.5 % (ref 11.5–14.6)
WBC: 6 10*3/uL (ref 4.5–10.5)

## 2011-08-14 LAB — HEPATIC FUNCTION PANEL
AST: 21 U/L (ref 0–37)
Albumin: 4.1 g/dL (ref 3.5–5.2)
Alkaline Phosphatase: 58 U/L (ref 39–117)
Total Protein: 7.4 g/dL (ref 6.0–8.3)

## 2011-08-14 LAB — LIPID PANEL
Cholesterol: 156 mg/dL (ref 0–200)
HDL: 36.9 mg/dL — ABNORMAL LOW (ref >39.00)
Triglycerides: 66 mg/dL (ref 0.0–149.0)

## 2011-08-20 ENCOUNTER — Encounter: Payer: Self-pay | Admitting: Internal Medicine

## 2011-08-20 ENCOUNTER — Ambulatory Visit (INDEPENDENT_AMBULATORY_CARE_PROVIDER_SITE_OTHER): Payer: BC Managed Care – PPO | Admitting: Internal Medicine

## 2011-08-20 VITALS — BP 110/72 | HR 75 | Temp 98.5°F | Ht 63.0 in | Wt 155.1 lb

## 2011-08-20 DIAGNOSIS — K219 Gastro-esophageal reflux disease without esophagitis: Secondary | ICD-10-CM

## 2011-08-20 DIAGNOSIS — R0789 Other chest pain: Secondary | ICD-10-CM

## 2011-08-20 DIAGNOSIS — Z23 Encounter for immunization: Secondary | ICD-10-CM

## 2011-08-20 DIAGNOSIS — Z0000 Encounter for general adult medical examination without abnormal findings: Secondary | ICD-10-CM

## 2011-08-20 MED ORDER — RANITIDINE HCL 150 MG PO TABS: 150.0000 mg | ORAL_TABLET | Freq: Two times a day (BID) | ORAL | Status: DC

## 2011-08-20 NOTE — Progress Notes (Signed)
Subjective:    Patient ID: [name], female    DOB: 1975/10/27, 36 y.o.   MRN: [id number]  HPI  patient is here today for annual physical. Patient feels well overall  complains of increased reflux symptoms since December 2012 delivery - not currently on any medications for same but previously on Nexium. Associated with prior hiatal hernia on EGD in 2006. Patient requests reevaluation by GI for same  Also complains of chest pain. Symptoms occur while at work. Not positional or exertional. Exacerbated by stress and anxiety. No radiation. No associated nausea or vomiting, but occasional shortness of breath  Past Medical History  Diagnosis Date  . ALLERGIC RHINITIS   . ASTHMA   . DEPRESSION   . GERD   . Headache   . HYPERTENSION   . IRITIS   . UNSPECIFIED OPTIC NEURITIS    Family History  Problem Relation Age of Onset  . Diabetes Mother   . Stroke Mother     x's 2 in 2010  . Clotting disorder Paternal Grandfather 28    d/t clot in heart  . Breast cancer Other   . Diabetes Other     parent, grandparent  . Hyperlipidemia Other     parent  . Hypertension Other     parent   History  Substance Use Topics  . Smoking status: Never Smoker   . Smokeless tobacco: Never Used   Comment: married, lives with spouse and son. 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 Alcohol Use: Yes     rarely    Review of Systems Constitutional: Negative for fever or weight change.  Respiratory: Negative for cough and shortness of breath.   Cardiovascular: Positive for episodes of substernal chest tightness with palpitations, ? Stress-induced at work.  Gastrointestinal: Negative for abdominal pain, no bowel changes.  Musculoskeletal: Negative for gait problem or joint swelling.  Skin: Negative for rash.  Neurological: Negative for dizziness or headache.  No other specific complaints in a complete review of systems (except as listed in HPI above).     Objective:   Physical  Exam BP 110/72  Pulse 75  Temp(Src) 98.5 F (36.9 C) (Oral)  Ht 5\' 3"  (1.6 m)  Wt 155 lb 1.9 oz (70.362 kg)  BMI 27.48 kg/m2  SpO2 97%  LMP 07/23/2011 Wt Readings from Last 3 Encounters:  08/20/11 155 lb 1.9 oz (70.362 kg)  08/06/11 158 lb (71.668 kg)  04/10/11 182 lb (82.555 kg)   Constitutional: She appears well-developed and well-nourished. No distress.  HENT: Head: Normocephalic and atraumatic. Ears: B TMs ok, no erythema or effusion; Nose: Nose normal. Mouth/Throat: Oropharynx is clear and moist. No oropharyngeal exudate.  Eyes: Conjunctivae and EOM are normal. Pupils are equal, round, and reactive to light. No scleral icterus.  Neck: Normal range of motion. Neck supple. No JVD present. No thyromegaly present.  Cardiovascular: Normal rate, regular rhythm and normal heart sounds.  No murmur heard. No BLE edema. Pulmonary/Chest: Effort normal and breath sounds normal. No respiratory distress. She has no wheezes.  Abdominal: Soft. Bowel sounds are normal. She exhibits no distension. There is no tenderness. no masses Musculoskeletal: Normal range of motion, no joint effusions. No gross deformities Neurological: She is alert and oriented to person, place, and time. No cranial nerve deficit. Coordination normal.  Skin: Skin is warm and dry. No rash noted. No erythema.  Psychiatric: She has a normal mood and affect. Her behavior is normal. Judgment and thought content normal.  Lab Results  Component Value Date   WBC 6.0 08/14/2011   HGB 12.6 08/14/2011   HCT 37.6 08/14/2011   PLT 233.0 08/14/2011   GLUCOSE 87 08/14/2011   CHOL 156 08/14/2011   TRIG 66.0 08/14/2011   HDL 36.90* 08/14/2011   LDLCALC 106* 08/14/2011   ALT 27 08/14/2011   AST 21 08/14/2011   NA 140 08/14/2011   K 4.0 08/14/2011   CL 105 08/14/2011   CREATININE 0.8 08/14/2011   BUN 14 08/14/2011   CO2 27 08/14/2011   TSH 2.03 08/14/2011   EKG: Normal sinus rhythm, no ischemic or arrhythmic change    Assessment & Plan:  CPX/v70.0 - Patient  has been counseled on age-appropriate routine health concerns for screening and prevention. These are reviewed and up-to-date. Immunizations are up-to-date or declined. Labs reviewed.  atypical chest pain - stress induced, occurs at work, not exertional or positional - EKG today - NSR, no arrhythmia or ischemic change. Reassurance provided. Patient to call if symptoms worse or unimproved - declines need for anxiety medication at this time

## 2011-08-20 NOTE — Assessment & Plan Note (Signed)
Increase symptoms since pregnancy 2012 - Hx HH on EGD 2006 - Will refer to GI at pt request, ?repeat EGD- defer to GI Previously on Nexium - will start daily Zantac pending GI eval

## 2011-08-20 NOTE — Patient Instructions (Signed)
if your symptoms continue to worsen (pain, fever, etc), or if you are unable take anything by mouth (pills, fluids, etc), you should go to the emergency room for further evaluation and treatment. We have reviewed your interval records including labs and tests today Health Maintenance reviewed - Tdap today - all other recommended immunizations and age-appropriate screenings are up-to-date.  start Zanatc daily for reflux symptoms pending follow up evaluation by Dr Matthias Hughs - Your prescription(s) have been submitted to your pharmacy. Please take as directed and contact our office if you believe you are having problem(s) with the medication(s). we'll make referral to GI/Buccini as discussed . Our office will contact you regarding appointment(s) once made.

## 2012-04-23 ENCOUNTER — Other Ambulatory Visit (INDEPENDENT_AMBULATORY_CARE_PROVIDER_SITE_OTHER): Payer: BC Managed Care – PPO

## 2012-04-23 ENCOUNTER — Encounter: Payer: Self-pay | Admitting: Internal Medicine

## 2012-04-23 ENCOUNTER — Ambulatory Visit (INDEPENDENT_AMBULATORY_CARE_PROVIDER_SITE_OTHER): Payer: BC Managed Care – PPO | Admitting: Internal Medicine

## 2012-04-23 VITALS — BP 110/82 | HR 75 | Temp 98.6°F | Ht 62.0 in | Wt 156.2 lb

## 2012-04-23 DIAGNOSIS — R200 Anesthesia of skin: Secondary | ICD-10-CM

## 2012-04-23 DIAGNOSIS — E162 Hypoglycemia, unspecified: Secondary | ICD-10-CM

## 2012-04-23 DIAGNOSIS — F4321 Adjustment disorder with depressed mood: Secondary | ICD-10-CM

## 2012-04-23 DIAGNOSIS — R209 Unspecified disturbances of skin sensation: Secondary | ICD-10-CM

## 2012-04-23 LAB — HEMOGLOBIN A1C: Hgb A1c MFr Bld: 5.8 % (ref 4.6–6.5)

## 2012-04-23 NOTE — Progress Notes (Signed)
  Subjective:    Patient ID: [name], female    DOB: 07-25-1975, 36 y.o.   MRN: [id number]  HPI   complains of numbness B legs No weakness  Past Medical History  Diagnosis Date  . ALLERGIC RHINITIS   . ASTHMA   . DEPRESSION   . GERD   . Headache   . HYPERTENSION   . IRITIS   . UNSPECIFIED OPTIC NEURITIS     Review of Systems  Constitutional: Positive for fatigue. Negative for fever.  Respiratory: Negative for cough and shortness of breath.   Cardiovascular: Positive for palpitations (occassional). Negative for chest pain.       Objective:   Physical Exam BP 110/82  Pulse 75  Temp 98.6 F (37 C) (Oral)  Ht 5\' 2"  (1.575 m)  Wt 156 lb 3.2 oz (70.852 kg)  BMI 28.57 kg/m2  SpO2 98% Wt Readings from Last 3 Encounters:  04/23/12 156 lb 3.2 oz (70.852 kg)  08/20/11 155 lb 1.9 oz (70.362 kg)  08/06/11 158 lb (71.668 kg)   Constitutional: She appears well-developed and well-nourished. No distress.  Eyes: Conjunctivae and EOM are normal. Pupils are equal, round, and reactive to light. No scleral icterus.  Neck: Normal range of motion. Neck supple. No JVD present. No thyromegaly present.  Cardiovascular: Normal rate, regular rhythm and normal heart sounds.  No murmur heard. No BLE edema. Pulmonary/Chest: Effort normal and breath sounds normal. No respiratory distress. She has no wheezes.  Musculoskeletal: Normal range of motion, no joint effusions. No gross deformities Neurological: She is alert and oriented to person, place, and time. No cranial nerve deficit. Coordination, balance and gait normal.  Skin: Skin is warm and dry. No rash noted. No erythema.  Psychiatric: She has a normal mood and affect. Her behavior is normal. Judgment and thought content normal.   Lab Results  Component Value Date   WBC 6.0 08/14/2011   HGB 12.6 08/14/2011   HCT 37.6 08/14/2011   PLT 233.0 08/14/2011   GLUCOSE 87 08/14/2011   CHOL 156 08/14/2011   TRIG 66.0 08/14/2011   HDL 36.90* 08/14/2011   LDLCALC 106* 08/14/2011   ALT 27 08/14/2011   AST 21 08/14/2011   NA 140 08/14/2011   K 4.0 08/14/2011   CL 105 08/14/2011   CREATININE 0.8 08/14/2011   BUN 14 08/14/2011   CO2 27 08/14/2011   TSH 2.03 08/14/2011   No results found for this basename: VITAMINB12   MRI brain 01/06/10: IMPRESSION: No evidence of multiple sclerosis.    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  This is an incidental finding.      Assessment & Plan:    BLE numbness -no motor weakness - check B12 and TSH -  symptoms of hypoglycemia? hx same (glc 46 in ER 2006), abnormal gestational GTT and strong FH DM so will also check a1c consider NCS if labs normal Prior MRI brain 02-05-10: no MS  Grief - death of mom 04-01-2012 - support offered - verified no si/hi - declines need for sleep aid or other med - pt agrees to call if worse symptoms or unimproved in 3-6 months

## 2012-04-23 NOTE — Patient Instructions (Signed)
It was good to see you today. We have reviewed your prior records including labs and tests today Test(s) ordered today. Your results will be released to MyChart (or called to you) after review, usually within 72hours after test completion. If any changes need to be made, you will be notified at that same time. If normal labs and continued symptoms, will refer for nerve conduction study as discussed I am sorry to hear of the death of your mom - please let me know if i can help you get through this time

## 2012-04-24 ENCOUNTER — Telehealth: Payer: Self-pay | Admitting: *Deleted

## 2012-04-24 LAB — TSH: TSH: 1.49 u[IU]/mL (ref 0.35–5.50)

## 2012-04-24 NOTE — Telephone Encounter (Signed)
Inform pt with labs results. Pt states md told her if all labs was normal would consider additional testing ? Nerve conduction test.../lmb

## 2012-04-25 ENCOUNTER — Encounter: Payer: Self-pay | Admitting: Internal Medicine

## 2012-04-25 DIAGNOSIS — R200 Anesthesia of skin: Secondary | ICD-10-CM

## 2012-04-28 NOTE — Telephone Encounter (Signed)
MD has order test. See e-mail from pt...lmb

## 2012-05-02 ENCOUNTER — Encounter: Payer: Self-pay | Admitting: Internal Medicine

## 2012-05-26 ENCOUNTER — Encounter: Payer: Self-pay | Admitting: Internal Medicine

## 2012-05-30 ENCOUNTER — Encounter: Payer: Self-pay | Admitting: Internal Medicine

## 2012-06-21 ENCOUNTER — Other Ambulatory Visit: Payer: Self-pay

## 2012-08-16 ENCOUNTER — Encounter: Payer: Self-pay | Admitting: Internal Medicine

## 2012-08-18 NOTE — Telephone Encounter (Signed)
Called pt no answer LMOM need ov...lmb

## 2012-08-18 NOTE — Telephone Encounter (Signed)
Please schedule pt for OV to discuss depression and meds for tx same - thanks

## 2012-08-22 ENCOUNTER — Ambulatory Visit (INDEPENDENT_AMBULATORY_CARE_PROVIDER_SITE_OTHER): Payer: BC Managed Care – PPO | Admitting: Internal Medicine

## 2012-08-22 ENCOUNTER — Encounter: Payer: Self-pay | Admitting: Internal Medicine

## 2012-08-22 VITALS — BP 118/82 | HR 77 | Temp 97.9°F | Resp 14 | Wt 162.5 lb

## 2012-08-22 DIAGNOSIS — F329 Major depressive disorder, single episode, unspecified: Secondary | ICD-10-CM

## 2012-08-22 MED ORDER — ALPRAZOLAM 0.25 MG PO TABS: 0.2500 mg | ORAL_TABLET | Freq: Two times a day (BID) | ORAL | Status: DC | PRN

## 2012-08-22 MED ORDER — PAROXETINE HCL 20 MG PO TABS: 20.0000 mg | ORAL_TABLET | ORAL | Status: DC

## 2012-08-22 NOTE — Assessment & Plan Note (Signed)
Recurrent symptoms - hx same Prior tx with paxil, effexor and recently cymbalta - also prn lorazepam (but caused sedation) Reports paxil worked well - resume same now and titrate as needed - erx done Also xanax 0.25 prn Verified no SI/HI follow up 4-8 weeks on symptoms - pt will contact us sooner if problems

## 2012-08-22 NOTE — Progress Notes (Signed)
  Subjective:    Patient ID: [name], female    DOB: 1975-07-31, 37 y.o.   MRN: [id number]  HPI  Here for depression symptoms  Hx same, increased since death of mom Exacerbated by personal stressors at home and work Overlap with anxiety symptoms   Past Medical History  Diagnosis Date  . ALLERGIC RHINITIS   . ASTHMA   . DEPRESSION   . GERD   . Headache   . HYPERTENSION   . IRITIS   . UNSPECIFIED OPTIC NEURITIS     Review of Systems  Constitutional: Negative for fever and fatigue.  Neurological: Negative for numbness and headaches.  Psychiatric/Behavioral: Positive for sleep disturbance, dysphoric mood and decreased concentration. Negative for suicidal ideas and self-injury. The patient is nervous/anxious.        Objective:   Physical Exam BP 118/82  Pulse 77  Temp(Src) 97.9 F (36.6 C) (Oral)  Resp 14  Wt 162 lb 8 oz (73.71 kg)  BMI 29.71 kg/m2  SpO2 96%  LMP 08/22/2012 Wt Readings from Last 3 Encounters:  08/22/12 162 lb 8 oz (73.71 kg)  04/23/12 156 lb 3.2 oz (70.852 kg)  08/20/11 155 lb 1.9 oz (70.362 kg)   Constitutional: She appears well-developed and well-nourished. No distress.   Neck: Normal range of motion. Neck supple. No JVD present. No thyromegaly present.  Cardiovascular: Normal rate, regular rhythm and normal heart sounds.  No murmur heard. No BLE edema. Pulmonary/Chest: Effort normal and breath sounds normal. No respiratory distress. She has no wheezes. Psychiatric: She has a dysphoric mood and affect. Her behavior is normal. Judgment and thought content normal.   Lab Results  Component Value Date   WBC 6.0 08/14/2011   HGB 12.6 08/14/2011   HCT 37.6 08/14/2011   PLT 233.0 08/14/2011   GLUCOSE 87 08/14/2011   CHOL 156 08/14/2011   TRIG 66.0 08/14/2011   HDL 36.90* 08/14/2011   LDLCALC 106* 08/14/2011   ALT 27 08/14/2011   AST 21 08/14/2011   NA 140 08/14/2011   K 4.0 08/14/2011   CL 105 08/14/2011   CREATININE 0.8 08/14/2011   BUN 14 08/14/2011   CO2 27  08/14/2011   TSH 1.49 04/23/2012   HGBA1C 5.8 04/23/2012       Assessment & Plan:   See problem list. Medications and labs reviewed today.

## 2012-08-22 NOTE — Patient Instructions (Signed)
It was good to see you today. We have reviewed your prior records including labs and tests today Start Paxil as discussed - also generic xanax as needed for anxiety/irritability spells Your prescription(s) have been submitted to your pharmacy. Please take as directed and contact our office if you believe you are having problem(s) with the medication(s). Please schedule followup in 4-8 weeks for symptoms and medication review, call sooner if problems. Depression, Adult Depression refers to feeling sad, low, down in the dumps, blue, gloomy, or empty. In general, there are two kinds of depression: 1. Depression that we all experience from time to time because of upsetting life experiences, including the loss of a job or the ending of a relationship (normal sadness or normal grief). This kind of depression is considered normal, is short lived, and resolves within a few days to 2 weeks. (Depression experienced after the loss of a loved one is called bereavement. Bereavement often lasts longer than 2 weeks but normally gets better with time.) 2. Clinical depression, which lasts longer than normal sadness or normal grief or interferes with your ability to function at home, at work, and in school. It also interferes with your personal relationships. It affects almost every aspect of your life. Clinical depression is an illness. Symptoms of depression also can be caused by conditions other than normal sadness and grief or clinical depression. Examples of these conditions are listed as follows:  Physical illness Some physical illnesses, including underactive thyroid gland (hypothyroidism), severe anemia, specific types of cancer, diabetes, uncontrolled seizures, heart and lung problems, strokes, and chronic pain are commonly associated with symptoms of depression.  Side effects of some prescription medicine In some people, certain types of prescription medicine can cause symptoms of depression.  Substance abuse  Abuse of alcohol and illicit drugs can cause symptoms of depression. SYMPTOMS Symptoms of normal sadness and normal grief include the following:  Feeling sad or crying for short periods of time.  Not caring about anything (apathy).  Difficulty sleeping or sleeping too much.  No longer able to enjoy the things you used to enjoy.  Desire to be by oneself all the time (social isolation).  Lack of energy or motivation.  Difficulty concentrating or remembering.  Change in appetite or weight.  Restlessness or agitation. Symptoms of clinical depression include the same symptoms of normal sadness or normal grief and also the following symptoms:  Feeling sad or crying all the time.  Feelings of guilt or worthlessness.  Feelings of hopelessness or helplessness.  Thoughts of suicide or the desire to harm yourself (suicidal ideation).  Loss of touch with reality (psychotic symptoms). Seeing or hearing things that are not real (hallucinations) or having false beliefs about your life or the people around you (delusions and paranoia). DIAGNOSIS  The diagnosis of clinical depression usually is based on the severity and duration of the symptoms. Your caregiver also will ask you questions about your medical history and substance use to find out if physical illness, use of prescription medicine, or substance abuse is causing your depression. Your caregiver also may order blood tests. TREATMENT  Typically, normal sadness and normal grief do not require treatment. However, sometimes antidepressant medicine is prescribed for bereavement to ease the depressive symptoms until they resolve. The treatment for clinical depression depends on the severity of your symptoms but typically includes antidepressant medicine, counseling with a mental health professional, or a combination of both. Your caregiver will help to determine what treatment is best for you. Depression  caused by physical illness usually goes  away with appropriate medical treatment of the illness. If prescription medicine is causing depression, talk with your caregiver about stopping the medicine, decreasing the dose, or substituting another medicine. Depression caused by abuse of alcohol or illicit drugs abuse goes away with abstinence from these substances. Some adults need professional help in order to stop drinking or using drugs. SEEK IMMEDIATE CARE IF:  You have thoughts about hurting yourself or others.  You lose touch with reality (have psychotic symptoms).  You are taking medicine for depression and have a serious side effect. FOR MORE INFORMATION National Alliance on Mental Illness: www.nami.[employer]: http://www.maynard.net/ Document Released: 04/20/2000 Document Revised: 10/23/2011 Document Reviewed: 07/23/2011 [facility] Patient Information 2013 [place], [place].

## 2012-10-30 ENCOUNTER — Encounter: Payer: Self-pay | Admitting: Internal Medicine

## 2013-01-27 ENCOUNTER — Encounter: Payer: Self-pay | Admitting: Internal Medicine

## 2013-01-28 ENCOUNTER — Other Ambulatory Visit: Payer: Self-pay | Admitting: *Deleted

## 2013-01-28 MED ORDER — PAROXETINE HCL 20 MG PO TABS: 20.0000 mg | ORAL_TABLET | ORAL | Status: DC

## 2013-01-28 NOTE — Telephone Encounter (Signed)
Sent email needing refill on her paxil...lmb 

## 2013-01-28 NOTE — Telephone Encounter (Signed)
Sent email needing refill on her paxil...lmb

## 2013-03-12 ENCOUNTER — Other Ambulatory Visit: Payer: Self-pay

## 2013-03-29 ENCOUNTER — Encounter: Payer: Self-pay | Admitting: Internal Medicine

## 2013-03-30 MED ORDER — PAROXETINE HCL 40 MG PO TABS: 40.0000 mg | ORAL_TABLET | ORAL | Status: DC

## 2013-03-30 NOTE — Telephone Encounter (Signed)
Entered flu shot forwarding msg to md for her recommendation on pt other concerns...Raechel Chute

## 2013-04-27 ENCOUNTER — Encounter: Payer: Self-pay | Admitting: Internal Medicine

## 2013-06-08 ENCOUNTER — Other Ambulatory Visit (INDEPENDENT_AMBULATORY_CARE_PROVIDER_SITE_OTHER): Payer: 59

## 2013-06-08 ENCOUNTER — Ambulatory Visit (INDEPENDENT_AMBULATORY_CARE_PROVIDER_SITE_OTHER): Payer: 59 | Admitting: Family Medicine

## 2013-06-08 ENCOUNTER — Encounter: Payer: Self-pay | Admitting: *Deleted

## 2013-06-08 ENCOUNTER — Encounter: Payer: Self-pay | Admitting: Family Medicine

## 2013-06-08 ENCOUNTER — Telehealth: Payer: Self-pay | Admitting: Internal Medicine

## 2013-06-08 VITALS — BP 120/78 | HR 90 | Temp 99.5°F | Resp 16 | Wt 174.2 lb

## 2013-06-08 DIAGNOSIS — R079 Chest pain, unspecified: Secondary | ICD-10-CM

## 2013-06-08 DIAGNOSIS — F329 Major depressive disorder, single episode, unspecified: Secondary | ICD-10-CM

## 2013-06-08 DIAGNOSIS — F411 Generalized anxiety disorder: Secondary | ICD-10-CM

## 2013-06-08 DIAGNOSIS — I10 Essential (primary) hypertension: Secondary | ICD-10-CM

## 2013-06-08 DIAGNOSIS — F3289 Other specified depressive episodes: Secondary | ICD-10-CM

## 2013-06-08 LAB — CBC WITH DIFFERENTIAL/PLATELET
BASOS ABS: 0.1 10*3/uL (ref 0.0–0.1)
Basophils Relative: 0.7 % (ref 0.0–3.0)
EOS ABS: 0.1 10*3/uL (ref 0.0–0.7)
Eosinophils Relative: 0.6 % (ref 0.0–5.0)
HEMATOCRIT: 40.8 % (ref 36.0–46.0)
Hemoglobin: 13.7 g/dL (ref 12.0–15.0)
LYMPHS ABS: 2.5 10*3/uL (ref 0.7–4.0)
Lymphocytes Relative: 22 % (ref 12.0–46.0)
MCHC: 33.5 g/dL (ref 30.0–36.0)
MCV: 85.8 fl (ref 78.0–100.0)
MONO ABS: 0.6 10*3/uL (ref 0.1–1.0)
Monocytes Relative: 5.4 % (ref 3.0–12.0)
Neutro Abs: 8.2 10*3/uL — ABNORMAL HIGH (ref 1.4–7.7)
Neutrophils Relative %: 71.3 % (ref 43.0–77.0)
PLATELETS: 302 10*3/uL (ref 150.0–400.0)
RBC: 4.76 Mil/uL (ref 3.87–5.11)
RDW: 12.4 % (ref 11.5–14.6)
WBC: 11.6 10*3/uL — ABNORMAL HIGH (ref 4.5–10.5)

## 2013-06-08 LAB — COMPREHENSIVE METABOLIC PANEL
ALBUMIN: 3.9 g/dL (ref 3.5–5.2)
ALK PHOS: 62 U/L (ref 39–117)
ALT: 24 U/L (ref 0–35)
AST: 19 U/L (ref 0–37)
BUN: 12 mg/dL (ref 6–23)
CO2: 27 meq/L (ref 19–32)
Calcium: 9.2 mg/dL (ref 8.4–10.5)
Chloride: 104 mEq/L (ref 96–112)
Creatinine, Ser: 0.8 mg/dL (ref 0.4–1.2)
GFR: 80.74 mL/min (ref >60.00)
Glucose, Bld: 89 mg/dL (ref 70–99)
POTASSIUM: 3.8 meq/L (ref 3.5–5.1)
SODIUM: 138 meq/L (ref 135–145)
TOTAL PROTEIN: 7.6 g/dL (ref 6.0–8.3)
Total Bilirubin: 0.4 mg/dL (ref 0.3–1.2)

## 2013-06-08 LAB — IBC PANEL
Iron: 43 ug/dL (ref 42–145)
Saturation Ratios: 9 % — ABNORMAL LOW (ref 20.0–50.0)
Transferrin: 341.7 mg/dL (ref 212.0–360.0)

## 2013-06-08 LAB — LIPID PANEL
CHOLESTEROL: 157 mg/dL (ref 0–200)
HDL: 41 mg/dL (ref >39.00)
LDL CALC: 86 mg/dL (ref 0–99)
TRIGLYCERIDES: 149 mg/dL (ref 0.0–149.0)
Total CHOL/HDL Ratio: 4
VLDL: 29.8 mg/dL (ref 0.0–40.0)

## 2013-06-08 LAB — T4, FREE: Free T4: 0.74 ng/dL (ref 0.60–1.60)

## 2013-06-08 LAB — FERRITIN: Ferritin: 31.4 ng/mL (ref 10.0–291.0)

## 2013-06-08 LAB — TSH: TSH: 2.16 u[IU]/mL (ref 0.35–5.50)

## 2013-06-08 MED ORDER — HYDROXYZINE HCL 25 MG PO TABS: 25.0000 mg | ORAL_TABLET | Freq: Three times a day (TID) | ORAL | Status: DC | PRN

## 2013-06-08 NOTE — Telephone Encounter (Signed)
Patient Information:  Caller Name: Huntley DecSara  Phone: [phone]  Patient: [name], [name]  Gender: Female  DOB: [birth year]  Age: 38 Years  PCP: Rene PaciLeschber, Valerie (Adults only)  Pregnant: No  Office Follow Up:  Does the office need to follow up with this patient?: No  Instructions For The Office: N/A   Symptoms  Reason For Call & Symptoms: Pt calling regarding chest and shoulder pain on left side for about a week-comes and goes. None now. HBP: 130/94 , HR 96 at 1400 on 06/08/13. Denies any sx now other than HR elevated to 96. Pt states she has had episodes like this in the past and has talked to Dr. Felicity CoyerLeschber about them. Possible caffeine/stress. [facility] and is stressed.  Reviewed Health History In EMR: Yes  Reviewed Medications In EMR: Yes  Reviewed Allergies In EMR: Yes  Reviewed Surgeries / Procedures: Yes  Date of Onset of Symptoms: 06/01/2013  Treatments Tried: Relaxation  Treatments Tried Worked: Yes OB / GYN:  LMP: 06/03/2013  Guideline(s) Used:  Chest Pain  Disposition Per Guideline:   Go to ED Now (or to Office with PCP Approval)  Reason For Disposition Reached:   Intermittent chest pain and pain has been increasing in severity or frequency  Advice Given: Emergent care advice given.   Patient Will Follow Care Advice:  YES  Appointment Scheduled:  06/08/2013 15:45:00 Appointment Scheduled Provider:  Terrilee FilesSmith, Zach

## 2013-06-08 NOTE — Progress Notes (Signed)
Subjective:  [name] is a 38 y.o. female with hypertension. Patient is a 38 year old female with past medical history significant for hypertension coming in with complaints of chest as well as left shoulder pain for about one week. Patient states it is intermittent. Patient does check her blood pressure greater basis and it has been more elevated than usual. Patient's home blood pressure monitor stated that her systolics were in the 130s in her diastolic were in the 90s. Heart rate in the 90s as well. Patient states that she has had some more stress recently and does drink caffeine on a regular basis. Patient denies any new medications. Patient denies any radiation up to her neck, any trouble with word finding, any weakness in her extremities. Current Outpatient Prescriptions  Medication Sig Dispense Refill  . ALPRAZolam (XANAX) 0.25 MG tablet Take 1 tablet (0.25 mg total) by mouth 2 (two) times daily as needed for sleep or anxiety.  30 tablet  0  . Multiple Vitamins-Minerals (MULTIVITAMIN GUMMIES ADULT) CHEW Chew 1 each by mouth daily.      . norethindrone-ethinyl estradiol (JUNEL FE,GILDESS FE,LOESTRIN FE) 1-20 MG-MCG tablet Take 1 tablet by mouth daily.      Marland Kitchen. PARoxetine (PAXIL) 40 MG tablet Take 1 tablet (40 mg total) by mouth every morning.  30 tablet  5  . hydrOXYzine (ATARAX/VISTARIL) 25 MG tablet Take 1 tablet (25 mg total) by mouth 3 (three) times daily as needed.  30 tablet  0   No current facility-administered medications for this visit.    Hypertension ROS: taking medications as instructed, no medication side effects noted, no TIA's, no chest pain on exertion, no dyspnea on exertion, no swelling of ankles, no orthostatic dizziness or lightheadedness and no intermittent claudication symptoms.   EKG was reviewed by me to did not show any specific findings. Patient did have a normal sinus rhythm.  Objective:  BP 120/78  Pulse 90  Temp(Src) 99.5 F (37.5 C) (Oral)  Resp 16  Wt  174 lb 3.2 oz (79.017 kg)  SpO2 97%  Appearance alert, well appearing, and in mild distress and oriented to person, place, and time. General exam BP noted to be well controlled today in office, S1, S2 normal, no gallop, no murmur, chest clear, no JVD, no HSM, no edema, CVS exam  - normal rate, regular rhythm, normal S1, S2, no murmurs, rubs, clicks or gallops.  Lab review: labs reviewed need new labs due to greater >4146yr. Assessment:   Hypertension well controlled and will get labs, .   Plan:  Lab results and schedule of future lab studies reviewed with patient. Repeat labs ordered prior to next appointment. Orders and follow up as documented in patient record. Reviewed diet, exercise and weight control. Follow up: 2 weeks and as needed. also discussed likely some anxiety and given RX for hydroxyszine. .Marland Kitchen

## 2013-06-08 NOTE — Assessment & Plan Note (Signed)
Decrease paxil back to 20mg  .

## 2013-06-08 NOTE — Patient Instructions (Signed)
Very nice to see you Good luck at the new job.  Try hydroxyzine if you need it for anxiety.  Decrease paxil to 20 mg Get labs downstairs Come back in 1-2 weeks to see PCP.

## 2013-06-08 NOTE — Progress Notes (Signed)
Pre-visit discussion using our clinic review tool. No additional management support is needed unless otherwise documented below in the visit note.  

## 2013-06-09 ENCOUNTER — Ambulatory Visit: Payer: BC Managed Care – PPO | Admitting: Internal Medicine

## 2013-06-09 LAB — VITAMIN D 25 HYDROXY (VIT D DEFICIENCY, FRACTURES): Vit D, 25-Hydroxy: 31 ng/mL (ref 30–89)

## 2013-06-15 ENCOUNTER — Encounter: Payer: Self-pay | Admitting: Internal Medicine

## 2013-06-15 ENCOUNTER — Encounter: Payer: Self-pay | Admitting: *Deleted

## 2013-06-15 ENCOUNTER — Ambulatory Visit (INDEPENDENT_AMBULATORY_CARE_PROVIDER_SITE_OTHER): Payer: 59 | Admitting: Internal Medicine

## 2013-06-15 VITALS — BP 100/82 | HR 83 | Temp 98.6°F | Wt 175.4 lb

## 2013-06-15 DIAGNOSIS — Z8249 Family history of ischemic heart disease and other diseases of the circulatory system: Secondary | ICD-10-CM

## 2013-06-15 DIAGNOSIS — F3289 Other specified depressive episodes: Secondary | ICD-10-CM

## 2013-06-15 DIAGNOSIS — F329 Major depressive disorder, single episode, unspecified: Secondary | ICD-10-CM

## 2013-06-15 MED ORDER — ASPIRIN EC 81 MG PO TBEC: 81.0000 mg | DELAYED_RELEASE_TABLET | Freq: Every day | ORAL | Status: DC

## 2013-06-15 MED ORDER — PAROXETINE HCL 20 MG PO TABS: 20.0000 mg | ORAL_TABLET | ORAL | Status: DC

## 2013-06-15 NOTE — Progress Notes (Signed)
  Subjective:    Patient ID: Audrey MenghiniSara B Tidd, female    DOB: October 26, 1975, 38 y.o.   MRN: [id number]  HPI  Here for follow up -anxiety induced stress and depression symptoms  Hx same, increased since death of mom Exacerbated by personal stressors at home and work Overlap with anxiety symptoms   Past Medical History  Diagnosis Date  . ALLERGIC RHINITIS   . ASTHMA   . DEPRESSION   . GERD   . Headache(784.0)   . HYPERTENSION   . IRITIS   . UNSPECIFIED OPTIC NEURITIS     Review of Systems  Constitutional: Negative for fever and fatigue.  Neurological: Negative for numbness and headaches.  Psychiatric/Behavioral: Positive for sleep disturbance, dysphoric mood and decreased concentration. Negative for suicidal ideas and self-injury. The patient is nervous/anxious.        Objective:   Physical Exam BP 100/82  Pulse 83  Temp(Src) 98.6 F (37 C) (Oral)  Wt 175 lb 6.4 oz (79.561 kg)  SpO2 97% Wt Readings from Last 3 Encounters:  06/15/13 175 lb 6.4 oz (79.561 kg)  06/08/13 174 lb 3.2 oz (79.017 kg)  08/22/12 162 lb 8 oz (73.71 kg)   Constitutional: She appears well-developed and well-nourished. No distress.   Neck: Normal range of motion. Neck supple. No JVD present. No thyromegaly present.  Cardiovascular: Normal rate, regular rhythm and normal heart sounds.  No murmur heard. No BLE edema. Pulmonary/Chest: Effort normal and breath sounds normal. No respiratory distress. She has no wheezes. Psychiatric: She has a dysphoric mood and affect. Her behavior is normal. Judgment and thought content normal.   Lab Results  Component Value Date   WBC 11.6* 06/08/2013   HGB 13.7 06/08/2013   HCT 40.8 06/08/2013   PLT 302.0 06/08/2013   GLUCOSE 89 06/08/2013   CHOL 157 06/08/2013   TRIG 149.0 06/08/2013   HDL 41.00 06/08/2013   LDLCALC 86 06/08/2013   ALT 24 06/08/2013   AST 19 06/08/2013   NA 138 06/08/2013   K 3.8 06/08/2013   CL 104 06/08/2013   CREATININE 0.8 06/08/2013   BUN 12 06/08/2013   CO2 27  06/08/2013   TSH 2.16 06/08/2013   HGBA1C 5.8 04/23/2012       Assessment & Plan:   Chest pain, non-cardiac. Anxiety related to employment stressors, now resolved -interval history reviewed. Support provided  Problem List Items Addressed This Visit   DEPRESSION - Primary     Recurrent symptoms, overlap with anxiety, exacerbated by employment stressors early 2015 -now she has changed jobs and is feeling optimistic Office visit earlier this month reviewed Decrease Paxil at that time related to chest tightness, reports symptoms are stable Prior tx with paxil, effexor and recently cymbalta - also prn lorazepam (but caused sedation) Continue same Paxil 20 mg daily now -titrate as needed in future - erx done Also xanax 0.25 prn Verified no SI/HI    Relevant Medications      PARoxetine (PAXIL) tablet     Family history CAD -inquires as to aspirin 81 mg daily -advised safe to be says as long as no GI symptoms or increased GERD -also advised qod dosing  Time spent with pt/family today 25 minutes, greater than 50% time spent counseling patient on depression/anxiety, FH CAD and medication review. Also review of prior records  See problem list. Medications and labs reviewed today.

## 2013-06-15 NOTE — Progress Notes (Signed)
[name] [id number] 06/15/2013   Chief Complaint  Patient presents with  . Follow-up    Subjective  HPI Patient is here for a 2 week follow up for chest pain.  Was seen by Dr. Katrinka Blazing on 06/08/13.  Chest Symptoms have resovled.   Paxil was decreased to 20mg  patient denies any SI/HI.  States a new onset of a head cold starting since Saturday with improvement using OTC medications.    Past Medical History  Diagnosis Date  . ALLERGIC RHINITIS   . ASTHMA   . DEPRESSION   . GERD   . Headache(784.0)   . HYPERTENSION   . IRITIS   . UNSPECIFIED OPTIC NEURITIS     Past Surgical History  Procedure Laterality Date  . Breast surgery  1993    (L) breast expander  . Left breast implant  1995    due to [place] Syndrome   . Cesarean section  04/10/2011    Procedure: CESAREAN SECTION;  Surgeon: Lenoard Aden, MD;  Location: [facility];  Service: Gynecology;  Laterality: N/A;  Primary Cesarean Section with birth of baby boy @ 2121    Family History  Problem Relation Age of Onset  . Diabetes Mother   . Stroke Mother     x's 2 in 2010  . Clotting disorder Paternal Grandfather 51    d/t clot in heart  . Breast cancer Paternal Aunt   . Diabetes Maternal Grandmother   . Hyperlipidemia Mother   . Hypertension Mother   . Heart attack Mother   . Atrial fibrillation Father     History  Substance Use Topics  . Smoking status: Never Smoker   . Smokeless tobacco: Never Used     Comment: married, lives with spouse and son. 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 Alcohol Use: Yes     Comment: rarely    Current Outpatient Prescriptions on File Prior to Visit  Medication Sig Dispense Refill  . ALPRAZolam (XANAX) 0.25 MG tablet Take 1 tablet (0.25 mg total) by mouth 2 (two) times daily as needed for sleep or anxiety.  30 tablet  0  . hydrOXYzine (ATARAX/VISTARIL) 25 MG tablet Take 1 tablet (25 mg total) by mouth 3 (three) times daily as needed.  30 tablet  0  . Multiple  Vitamins-Minerals (MULTIVITAMIN GUMMIES ADULT) CHEW Chew 1 each by mouth daily.      . norethindrone-ethinyl estradiol (JUNEL FE,GILDESS FE,LOESTRIN FE) 1-20 MG-MCG tablet Take 1 tablet by mouth daily.      Marland Kitchen PARoxetine (PAXIL) 40 MG tablet Take 1 tablet (40 mg total) by mouth every morning.  30 tablet  5   No current facility-administered medications on file prior to visit.    Allergies: No Known Allergies  Review of Systems  Constitutional: Negative for fever, chills, weight loss and malaise/fatigue.  HENT: Positive for congestion. Negative for ear pain, hearing loss and nosebleeds.   Eyes: Negative for blurred vision.  Respiratory: Negative for cough, shortness of breath and wheezing.   Cardiovascular: Negative for chest pain.  Gastrointestinal: Negative for heartburn, nausea and vomiting.  Neurological: Negative for speech change and headaches.  Psychiatric/Behavioral: Negative for depression and suicidal ideas. The patient is not nervous/anxious and does not have insomnia.        Objective  Filed Vitals:   06/15/13 0909  BP: 100/82  Pulse: 83  Temp: 98.6 F (37 C)  TempSrc: Oral  Weight: 175 lb 6.4 oz (79.561 kg)  SpO2: 97%    Physical Exam  Nursing note and vitals reviewed. Constitutional: She is oriented to person, place, and time. She appears well-developed and well-nourished. No distress.  HENT:  Head: Normocephalic and atraumatic.  Right Ear: External ear normal.  Left Ear: External ear normal.  Nose: Nose normal.  Mouth/Throat: No oropharyngeal exudate.  Eyes: Conjunctivae are normal. Pupils are equal, round, and reactive to light.  Neck: No JVD present.  Cardiovascular: Normal rate, regular rhythm, normal heart sounds and intact distal pulses.  Exam reveals no gallop and no friction rub.   No murmur heard. Respiratory: Effort normal and breath sounds normal. No stridor. No respiratory distress. She has no wheezes. She exhibits no tenderness.  GI: Soft.    Musculoskeletal: Normal range of motion. She exhibits no tenderness.  Lymphadenopathy:    She has no cervical adenopathy.  Neurological: She is alert and oriented to person, place, and time.  Skin: She is not diaphoretic.  Psychiatric: She has a normal mood and affect. Her speech is normal and behavior is normal. Judgment and thought content normal. Cognition and memory are normal.    BP Readings from Last 3 Encounters:  06/15/13 100/82  06/08/13 120/78  08/22/12 118/82    Wt Readings from Last 3 Encounters:  06/15/13 175 lb 6.4 oz (79.561 kg)  06/08/13 174 lb 3.2 oz (79.017 kg)  08/22/12 162 lb 8 oz (73.71 kg)    Lab Results  Component Value Date   WBC 11.6* 06/08/2013   HGB 13.7 06/08/2013   HCT 40.8 06/08/2013   PLT 302.0 06/08/2013   GLUCOSE 89 06/08/2013   CHOL 157 06/08/2013   TRIG 149.0 06/08/2013   HDL 41.00 06/08/2013   LDLCALC 86 06/08/2013   ALT 24 06/08/2013   AST 19 06/08/2013   NA 138 06/08/2013   K 3.8 06/08/2013   CL 104 06/08/2013   CREATININE 0.8 06/08/2013   BUN 12 06/08/2013   CO2 27 06/08/2013   TSH 2.16 06/08/2013   HGBA1C 5.8 04/23/2012    No results found.   Assessment and Plan  Chest pain-  Resolved. Was seen by Dr. Katrinka BlazingSmith on 06/08/2013 for same.  Dr.  Katrinka BlazingSmith reduced her paxil dose to 20mg /day due to possible side effects.  Reviewed ECG on 06/08/2013,  No specific findings.  NSR @  87.  Patient denies any chest pain, edema, palpatations, near syncope episodes, or DOE. Symptoms have resolved.  Patient attributes chest pain to stress with job.  She is in the process now of acquiring new employment.   Depressive disorder -  Dr. Katrinka BlazingSmith decreased the paxil dose on 06/08/2013 due to possible side effects .  Patient denies worsening of her depression symptoms.  Denies SI/HI.    Vaginal discharge -  Patient reports this symptom has been present for around 2 years.  She has been seen and evaulated by OBGYN for same. Patient reports no change with treatment for BV or yeast. Denies and Bowel  changes, stomach pains, or any additional urinary complaints.   Will defer to OBGYN for treatment.  Discussed possible  Switch in OBGYN provider.   URI - Patient stated symptoms are improving with OTC treatment.  No evidence of bacterial infection.  Symptoms are managed effectively with OTC medications.  Patient was instructed to notify the office if symptoms do not improve or worsen.  No antibiotics prescribed at this time. '  Patient was instructed to notify the office if any new symptoms emerged or the current  symptoms become worse.  Return in about 6 months (around 12/13/2013) for recheck and medication adjustment (if needed).  Jonna Coup    I have personally reviewed this case with PA student. I also personally examined this patient. I agree with history and findings as documented above. I reviewed, discussed and approve of the assessment and plan as listed above. Rene Paci, MD

## 2013-06-15 NOTE — Assessment & Plan Note (Signed)
Recurrent symptoms, overlap with anxiety, exacerbated by employment stressors early 2015 -now she has changed jobs and is feeling optimistic Office visit earlier this month reviewed Decrease Paxil at that time related to chest tightness, reports symptoms are stable Prior tx with paxil, effexor and recently cymbalta - also prn lorazepam (but caused sedation) Continue same Paxil 20 mg daily now -titrate as needed in future - erx done Also xanax 0.25 prn Verified no SI/HI

## 2013-06-15 NOTE — Progress Notes (Signed)
Pre-visit discussion using our clinic review tool. No additional management support is needed unless otherwise documented below in the visit note.  

## 2013-06-15 NOTE — Patient Instructions (Signed)
It was good to see you today.  We have reviewed your prior records including labs and tests today  Medications reviewed and updated, no changes recommended at this time.  Please schedule followup in 6 months for symptoms and medication review (also annual physical.labs), call sooner if problems. Depression, Adult Depression refers to feeling sad, low, down in the dumps, blue, gloomy, or empty. In general, there are two kinds of depression: 1. Depression that we all experience from time to time because of upsetting life experiences, including the loss of a job or the ending of a relationship (normal sadness or normal grief). This kind of depression is considered normal, is short lived, and resolves within a few days to 2 weeks. (Depression experienced after the loss of a loved one is called bereavement. Bereavement often lasts longer than 2 weeks but normally gets better with time.) 2. Clinical depression, which lasts longer than normal sadness or normal grief or interferes with your ability to function at home, at work, and in school. It also interferes with your personal relationships. It affects almost every aspect of your life. Clinical depression is an illness. Symptoms of depression also can be caused by conditions other than normal sadness and grief or clinical depression. Examples of these conditions are listed as follows:  Physical illness Some physical illnesses, including underactive thyroid gland (hypothyroidism), severe anemia, specific types of cancer, diabetes, uncontrolled seizures, heart and lung problems, strokes, and chronic pain are commonly associated with symptoms of depression.  Side effects of some prescription medicine In some people, certain types of prescription medicine can cause symptoms of depression.  Substance abuse Abuse of alcohol and illicit drugs can cause symptoms of depression. SYMPTOMS Symptoms of normal sadness and normal grief include the  following:  Feeling sad or crying for short periods of time.  Not caring about anything (apathy).  Difficulty sleeping or sleeping too much.  No longer able to enjoy the things you used to enjoy.  Desire to be by oneself all the time (social isolation).  Lack of energy or motivation.  Difficulty concentrating or remembering.  Change in appetite or weight.  Restlessness or agitation. Symptoms of clinical depression include the same symptoms of normal sadness or normal grief and also the following symptoms:  Feeling sad or crying all the time.  Feelings of guilt or worthlessness.  Feelings of hopelessness or helplessness.  Thoughts of suicide or the desire to harm yourself (suicidal ideation).  Loss of touch with reality (psychotic symptoms). Seeing or hearing things that are not real (hallucinations) or having false beliefs about your life or the people around you (delusions and paranoia). DIAGNOSIS  The diagnosis of clinical depression usually is based on the severity and duration of the symptoms. Your caregiver also will ask you questions about your medical history and substance use to find out if physical illness, use of prescription medicine, or substance abuse is causing your depression. Your caregiver also may order blood tests. TREATMENT  Typically, normal sadness and normal grief do not require treatment. However, sometimes antidepressant medicine is prescribed for bereavement to ease the depressive symptoms until they resolve. The treatment for clinical depression depends on the severity of your symptoms but typically includes antidepressant medicine, counseling with a mental health professional, or a combination of both. Your caregiver will help to determine what treatment is best for you. Depression caused by physical illness usually goes away with appropriate medical treatment of the illness. If prescription medicine is causing depression, talk with your  caregiver about  stopping the medicine, decreasing the dose, or substituting another medicine. Depression caused by abuse of alcohol or illicit drugs abuse goes away with abstinence from these substances. Some adults need professional help in order to stop drinking or using drugs. SEEK IMMEDIATE CARE IF:  You have thoughts about hurting yourself or others.  You lose touch with reality (have psychotic symptoms).  You are taking medicine for depression and have a serious side effect. FOR MORE INFORMATION National Alliance on Mental Illness: www.nami.[employer]: http://www.maynard.net/ Document Released: 04/20/2000 Document Revised: 10/23/2011 Document Reviewed: 07/23/2011 [facility] Patient Information 2013 [place], [place].

## 2013-09-07 ENCOUNTER — Encounter: Payer: Self-pay | Admitting: Internal Medicine

## 2013-09-08 MED ORDER — DESVENLAFAXINE SUCCINATE ER 50 MG PO TB24: 50.0000 mg | ORAL_TABLET | Freq: Every day | ORAL | Status: DC

## 2013-11-16 ENCOUNTER — Telehealth: Payer: Self-pay | Admitting: *Deleted

## 2013-11-16 NOTE — Telephone Encounter (Signed)
[facility] Triage Call Report Triage Record Num: [id number] Operator: Migdalia Dkee Taylor Patient Name: [name] Call Date & Time: 11/01/2013 12:06:47AM Patient Phone: [phone] PCP: Rene PaciValerie Leschber Patient Gender: Female PCP Fax : [phone] Patient DOB: 29-Mar-1976 Practice Name: [facility] Reason for Call: Caller: [name]/Patient; PCP: Rene PaciLeschber, Valerie (Adults only); CB#: [phone]; Call regarding Chest Pain/Chest Discomfort; Started with left sided chest pain around 2230 on 10/31/2013, "I was sitting at a desk at work , typing." Pain continues now, worsens with deep breath and movement of left arm. Pain under left breast down to bottom of ribcage and through to back. States pain also worse when lying down, better when sitting up and leaning forward. Triaged per Chest Pain Guideline, all triage questions negative until Symptoms worse when lying down and better when sitting and leaning forward, [facility]. Protocol(s) Used: Chest Pain Recommended Outcome per Protocol: See ED Immediately Reason for Outcome: Symptoms worse when lying down AND better when sitting and leaning forward Care Advice: ~ 11/01/2013 12:15:34AM Page 1 of 1 [id number]

## 2014-01-15 ENCOUNTER — Encounter: Payer: Self-pay | Admitting: Internal Medicine

## 2014-01-16 MED ORDER — VENLAFAXINE HCL ER 75 MG PO CP24: 75.0000 mg | ORAL_CAPSULE | Freq: Every day | ORAL | Status: DC

## 2014-03-08 ENCOUNTER — Encounter: Payer: Self-pay | Admitting: Internal Medicine

## 2014-04-16 ENCOUNTER — Encounter: Payer: Self-pay | Admitting: Internal Medicine

## 2014-07-06 ENCOUNTER — Encounter: Payer: Self-pay | Admitting: Internal Medicine

## 2014-07-06 ENCOUNTER — Ambulatory Visit (INDEPENDENT_AMBULATORY_CARE_PROVIDER_SITE_OTHER): Payer: 59 | Admitting: Internal Medicine

## 2014-07-06 VITALS — BP 112/80 | HR 75 | Temp 98.4°F | Ht 63.0 in | Wt 179.8 lb

## 2014-07-06 DIAGNOSIS — B029 Zoster without complications: Secondary | ICD-10-CM

## 2014-07-06 DIAGNOSIS — B0229 Other postherpetic nervous system involvement: Secondary | ICD-10-CM

## 2014-07-06 MED ORDER — FAMCICLOVIR 500 MG PO TABS: 500.0000 mg | ORAL_TABLET | Freq: Three times a day (TID) | ORAL | Status: DC

## 2014-07-06 MED ORDER — GABAPENTIN 100 MG PO CAPS: ORAL_CAPSULE | ORAL | Status: DC

## 2014-07-06 NOTE — Patient Instructions (Signed)
Assess response to the gabapentin one every 8 hours as needed. If it is partially beneficial, it can be increased up to a total of 3 pills every 8 hours as needed. This increase of 1 pill each dose  should take place over 72 hours at least.If 300 mg is effective dose ; there is a 300 mg pill. 

## 2014-07-06 NOTE — Progress Notes (Signed)
Pre visit review using our clinic review tool, if applicable. No additional management support is needed unless otherwise documented below in the visit note. 

## 2014-07-06 NOTE — Progress Notes (Signed)
   Subjective:    Patient ID: [name], female    DOB: 02/04/1976, 39 y.o.   MRN: [id number]  HPI  Her symptoms began as itching over the left inferior buttocks 07/01/14.  When she exam this area with her hand she felt bumps. Subsequently she has had associated pain described a slight aching.  She has no other associated constitutional symptoms or localizing signs of infection or systemic disease.  Review of Systems  No associated itchy, watery eyes.  Swelling of the lips or tongue or intraoral lesions denied.  Shortness of breath, wheezing, or cough absent.  No vesicles, pustules or urticaria noted.  Fever ,chills , or sweats denied.   Diarrhea not present.  No dysuria, pyuria or hematuria.     Objective:   Physical Exam General appearance:Adequately nourished; no acute distress or increased work of breathing is present.  No  lymphadenopathy about the head, neck, or axilla noted.   Eyes: No conjunctival inflammation or lid edema is present. There is no scleral icterus.  Ears:  External ear exam shows no significant lesions or deformities.  Otoscopic examination reveals clear canals, tympanic membranes are intact bilaterally without bulging, retraction, inflammation or discharge.  Nose:  External nasal examination shows no deformity or inflammation. Nasal mucosa are pink and moist without lesions or exudates. No septal dislocation or deviation.No obstruction to airflow.   Oral exam: Dental hygiene is good; lips and gums are healthy appearing.There is no oropharyngeal erythema or exudate noted.   Neck:  No deformities, thyromegaly, masses, or tenderness noted.   Supple with full range of motion without pain.   Heart:  Normal rate and regular rhythm. S1 and S2 normal without gallop, murmur, click, rub or other extra sounds.   Lungs:Chest clear to auscultation; no wheezes, rhonchi,rales ,or rubs present.  Extremities:  No cyanosis, edema, or clubbing  noted    Skin:  Warm & dry w/o jaundice or tenting.Dry erythematous papules with slight eschar; no vesicles.       Assessment & Plan:  #1 herpes zoster S2 dermatome See orders

## 2014-07-27 ENCOUNTER — Encounter: Payer: Self-pay | Admitting: Internal Medicine

## 2014-07-27 DIAGNOSIS — Z0000 Encounter for general adult medical examination without abnormal findings: Secondary | ICD-10-CM

## 2014-07-27 MED ORDER — VENLAFAXINE HCL ER 75 MG PO CP24: 75.0000 mg | ORAL_CAPSULE | Freq: Every day | ORAL | Status: DC

## 2014-07-29 ENCOUNTER — Other Ambulatory Visit: Payer: Self-pay | Admitting: Internal Medicine

## 2014-08-05 ENCOUNTER — Ambulatory Visit (INDEPENDENT_AMBULATORY_CARE_PROVIDER_SITE_OTHER)
Admission: RE | Admit: 2014-08-05 | Discharge: 2014-08-05 | Disposition: A | Discharge status: Home / Self Care | Payer: 59 | Source: Ambulatory Visit | Attending: Internal Medicine | Admitting: Internal Medicine

## 2014-08-05 ENCOUNTER — Ambulatory Visit (INDEPENDENT_AMBULATORY_CARE_PROVIDER_SITE_OTHER): Payer: 59 | Admitting: Internal Medicine

## 2014-08-05 ENCOUNTER — Encounter: Payer: Self-pay | Admitting: Internal Medicine

## 2014-08-05 VITALS — BP 122/82 | HR 85 | Temp 98.5°F | Resp 16 | Ht 63.0 in | Wt 179.5 lb

## 2014-08-05 DIAGNOSIS — M79671 Pain in right foot: Secondary | ICD-10-CM | POA: Diagnosis not present

## 2014-08-05 NOTE — Progress Notes (Signed)
   Subjective:    Patient ID: [name], female    DOB: 1976/04/04, 39 y.o.   MRN: [id number]  HPI Her symptoms started over a week ago as pain in the dorsum of the right foot at the base of the third and fourth toes.No specific trigger or injury;she has had only minor trauma foot trauma. The pain is described as up to level V and sharp/burning. It does radiate superiorly to the anterior ankle. She finds tennis shoes are the most comfortable footwear. Ice has not been of benefit. She's had some localized edema in the area the pain as well as at the proximal foot at the distal aspect of the ankle. She also describes muscle cramps.   Review of Systems She denies fever, chills, sweats. She has no numbness, tingling, weakness in the extremity.     Objective:   Physical Exam  Pertinent or positive findings include: There is full range of the motion of the foot and ankle without pain. With extension of the foot there is localized fullness at the proximal foot/inferior ankle area which mimics the appearance of a ganglion. There is some tenderness to deep palpation at the base of the third and fourth right toes. Pedal pulses are intact. Low arches. Exam for plantar fasciitis negative. Strength and tone are normal.  Heart rhythm and rate appeared to be normal; her child was crying so was difficult to auscultate the heart. She had to hold him to console him. Chest was clear.  Skin revealed no lesions.      Assessment & Plan:  #1 foot pain; R/O tarsal fracture See orders

## 2014-08-05 NOTE — Progress Notes (Signed)
Pre visit review using our clinic review tool, if applicable. No additional management support is needed unless otherwise documented below in the visit note. 

## 2014-08-05 NOTE — Patient Instructions (Signed)
  Wear whichever footwear decreases pressure on the involved area. Wear arch supports in all shoes.   If films are negative,do the tennis ball  exercises for the arch ; 20 reps twice a day. You can soak  foot in warm Epsom salts before going to bed.  If symptoms persist; a Sports Medicine referral is indicated.

## 2014-10-11 ENCOUNTER — Encounter: Payer: Self-pay | Admitting: Internal Medicine

## 2014-10-29 ENCOUNTER — Encounter: Payer: Self-pay | Admitting: Internal Medicine

## 2014-11-01 ENCOUNTER — Encounter: Payer: Self-pay | Admitting: Internal Medicine

## 2014-11-01 DIAGNOSIS — E119 Type 2 diabetes mellitus without complications: Secondary | ICD-10-CM

## 2014-11-01 DIAGNOSIS — Z0100 Encounter for examination of eyes and vision without abnormal findings: Principal | ICD-10-CM

## 2014-11-16 ENCOUNTER — Encounter: Payer: Self-pay | Admitting: Internal Medicine

## 2014-11-25 ENCOUNTER — Encounter: Payer: 59 | Admitting: Internal Medicine

## 2014-12-01 ENCOUNTER — Other Ambulatory Visit: Payer: Self-pay | Admitting: Internal Medicine

## 2015-02-01 ENCOUNTER — Encounter: Payer: Self-pay | Admitting: Internal Medicine

## 2015-02-10 ENCOUNTER — Encounter: Payer: Self-pay | Admitting: Internal Medicine

## 2015-02-10 DIAGNOSIS — Z0000 Encounter for general adult medical examination without abnormal findings: Secondary | ICD-10-CM

## 2015-02-10 NOTE — Telephone Encounter (Signed)
Called and spoke to the insurance company. They were able to correct the claim and are sending it back to be paid.   Referral number: [id number]

## 2015-02-11 NOTE — Telephone Encounter (Signed)
Pt email has a few questions on it for your review.

## 2015-02-17 ENCOUNTER — Other Ambulatory Visit (INDEPENDENT_AMBULATORY_CARE_PROVIDER_SITE_OTHER): Payer: 59

## 2015-02-17 DIAGNOSIS — Z0000 Encounter for general adult medical examination without abnormal findings: Secondary | ICD-10-CM | POA: Diagnosis not present

## 2015-02-17 LAB — CBC WITH DIFFERENTIAL/PLATELET
BASOS ABS: 0 10*3/uL (ref 0.0–0.1)
BASOS PCT: 0.5 % (ref 0.0–3.0)
Eosinophils Absolute: 0.1 10*3/uL (ref 0.0–0.7)
Eosinophils Relative: 0.8 % (ref 0.0–5.0)
HCT: 39 % (ref 36.0–46.0)
Hemoglobin: 12.9 g/dL (ref 12.0–15.0)
LYMPHS ABS: 1.3 10*3/uL (ref 0.7–4.0)
Lymphocytes Relative: 17.1 % (ref 12.0–46.0)
MCHC: 33.2 g/dL (ref 30.0–36.0)
MCV: 83.8 fl (ref 78.0–100.0)
MONOS PCT: 4.8 % (ref 3.0–12.0)
Monocytes Absolute: 0.4 10*3/uL (ref 0.1–1.0)
NEUTROS ABS: 6 10*3/uL (ref 1.4–7.7)
NEUTROS PCT: 76.8 % (ref 43.0–77.0)
PLATELETS: 266 10*3/uL (ref 150.0–400.0)
RBC: 4.66 Mil/uL (ref 3.87–5.11)
RDW: 12.2 % (ref 11.5–15.5)
WBC: 7.9 10*3/uL (ref 4.0–10.5)

## 2015-02-17 LAB — HEPATIC FUNCTION PANEL
ALBUMIN: 3.7 g/dL (ref 3.5–5.2)
ALK PHOS: 59 U/L (ref 39–117)
ALT: 23 U/L (ref 0–35)
AST: 18 U/L (ref 0–37)
Bilirubin, Direct: 0.1 mg/dL (ref 0.0–0.3)
Total Bilirubin: 0.5 mg/dL (ref 0.2–1.2)
Total Protein: 7.1 g/dL (ref 6.0–8.3)

## 2015-02-17 LAB — URINALYSIS, ROUTINE W REFLEX MICROSCOPIC
Bilirubin Urine: NEGATIVE
Hgb urine dipstick: NEGATIVE
Ketones, ur: NEGATIVE
Nitrite: NEGATIVE
PH: 6 (ref 5.0–8.0)
RBC / HPF: NONE SEEN (ref 0–?)
SPECIFIC GRAVITY, URINE: 1.025 (ref 1.000–1.030)
Total Protein, Urine: NEGATIVE
Urine Glucose: NEGATIVE
Urobilinogen, UA: 0.2 (ref 0.0–1.0)

## 2015-02-17 LAB — LIPID PANEL
CHOLESTEROL: 136 mg/dL (ref 0–200)
HDL: 35.9 mg/dL — AB (ref >39.00)
LDL Cholesterol: 86 mg/dL (ref 0–99)
NonHDL: 100.16
TRIGLYCERIDES: 71 mg/dL (ref 0.0–149.0)
Total CHOL/HDL Ratio: 4
VLDL: 14.2 mg/dL (ref 0.0–40.0)

## 2015-02-17 LAB — BASIC METABOLIC PANEL
BUN: 11 mg/dL (ref 6–23)
CALCIUM: 8.8 mg/dL (ref 8.4–10.5)
CO2: 25 meq/L (ref 19–32)
Chloride: 107 mEq/L (ref 96–112)
Creatinine, Ser: 0.73 mg/dL (ref 0.40–1.20)
GFR: 94.09 mL/min (ref >60.00)
GLUCOSE: 102 mg/dL — AB (ref 70–99)
Potassium: 4 mEq/L (ref 3.5–5.1)
SODIUM: 141 meq/L (ref 135–145)

## 2015-02-17 LAB — VITAMIN D 25 HYDROXY (VIT D DEFICIENCY, FRACTURES): VITD: 34.11 ng/mL (ref 30.00–100.00)

## 2015-02-17 LAB — TSH: TSH: 2.35 u[IU]/mL (ref 0.35–4.50)

## 2015-02-24 ENCOUNTER — Ambulatory Visit (INDEPENDENT_AMBULATORY_CARE_PROVIDER_SITE_OTHER): Payer: 59 | Admitting: Internal Medicine

## 2015-02-24 ENCOUNTER — Encounter: Payer: Self-pay | Admitting: Internal Medicine

## 2015-02-24 VITALS — BP 108/72 | HR 71 | Temp 98.3°F | Ht 63.0 in | Wt 175.5 lb

## 2015-02-24 DIAGNOSIS — Z0000 Encounter for general adult medical examination without abnormal findings: Secondary | ICD-10-CM | POA: Diagnosis not present

## 2015-02-24 DIAGNOSIS — E669 Obesity, unspecified: Secondary | ICD-10-CM | POA: Diagnosis not present

## 2015-02-24 NOTE — Progress Notes (Signed)
Subjective:    Patient ID: [name], female    DOB: Apr 04, 1976, 39 y.o.   MRN: [id number]  HPI  patient is here today for annual physical. Patient feels well overall Also reviewed chronic medical conditions, interval events and current concerns   Past Medical History  Diagnosis Date  . ALLERGIC RHINITIS   . DEPRESSION   . GERD   . Headache(784.0)   . IRITIS 2013    s/p injection in R eye  . UNSPECIFIED OPTIC NEURITIS    Family History  Problem Relation Age of Onset  . Diabetes Mother   . Stroke Mother     x's 2 in 2010  . Clotting disorder Maternal Grandfather 39    d/t clot in heart  . Breast cancer Paternal Aunt   . Diabetes Maternal Grandmother   . Hyperlipidemia Mother   . Hypertension Mother   . Heart attack Mother   . Atrial fibrillation Father   . Macular degeneration Father   . Stroke Maternal Uncle   . Kidney failure Maternal Uncle    Social History  Substance Use Topics  . Smoking status: Never Smoker   . Smokeless tobacco: Never Used  . Alcohol Use: 0.0 oz/week    0 Standard drinks or equivalent per week     Comment: rarely    Review of Systems  Constitutional: Negative for fatigue and unexpected weight change.  Respiratory: Negative for cough, chest tightness, shortness of breath and wheezing.   Cardiovascular: Positive for palpitations (occ, esp leaning forward, self limited). Negative for chest pain and leg swelling.  Gastrointestinal: Negative for nausea, abdominal pain and diarrhea.  Neurological: Negative for dizziness, weakness, light-headedness and headaches.  Psychiatric/Behavioral: Negative for dysphoric mood. The patient is nervous/anxious (situational).   All other systems reviewed and are negative.      Objective:    Physical Exam  Constitutional: She is oriented to person, place, and time. She appears well-developed and well-nourished. No distress.  obese  HENT:  Head: Normocephalic and atraumatic.  Right Ear: External  ear normal.  Left Ear: External ear normal.  Nose: Nose normal.  Mouth/Throat: Oropharynx is clear and moist. No oropharyngeal exudate.  Eyes: EOM are normal. Pupils are equal, round, and reactive to light. Right eye exhibits no discharge. Left eye exhibits no discharge. No scleral icterus.  Neck: Normal range of motion. Neck supple. No JVD present. No tracheal deviation present. No thyromegaly present.  Cardiovascular: Normal rate, regular rhythm, normal heart sounds and intact distal pulses.  Exam reveals no friction rub.   No murmur heard. Pulmonary/Chest: Effort normal and breath sounds normal. No respiratory distress. She has no wheezes. She has no rales. She exhibits no tenderness.  Abdominal: Soft. Bowel sounds are normal. She exhibits no distension and no mass. There is no tenderness. There is no rebound and no guarding.  Musculoskeletal: Normal range of motion.  Lymphadenopathy:    She has no cervical adenopathy.  Neurological: She is alert and oriented to person, place, and time. She has normal reflexes. No cranial nerve deficit.  Skin: Skin is warm and dry. No rash noted. She is not diaphoretic. No erythema.  Psychiatric: She has a normal mood and affect. Her behavior is normal. Judgment and thought content normal.  Nursing note and vitals reviewed.   BP 108/72 mmHg  Pulse 71  Temp(Src) 98.3 F (36.8 C) (Oral)  Ht  (1.6 m)  Wt 175 lb 8 oz (79.606 kg)  BMI 31.10 kg/m2  SpO2 97%  LMP 02/09/2015 Wt Readings from Last 3 Encounters:  02/24/15 175 lb 8 oz (79.606 kg)  08/05/14 179 lb 8 oz (81.421 kg)  07/06/14 179 lb 12 oz (81.534 kg)     Lab Results  Component Value Date   WBC 7.9 02/17/2015   HGB 12.9 02/17/2015   HCT 39.0 02/17/2015   PLT 266.0 02/17/2015   GLUCOSE 102* 02/17/2015   CHOL 136 02/17/2015   TRIG 71.0 02/17/2015   HDL 35.90* 02/17/2015   LDLCALC 86 02/17/2015   ALT 23 02/17/2015   AST 18 02/17/2015   NA 141 02/17/2015   K 4.0 02/17/2015   CL  107 02/17/2015   CREATININE 0.73 02/17/2015   BUN 11 02/17/2015   CO2 25 02/17/2015   TSH 2.35 02/17/2015   HGBA1C 5.8 04/23/2012    Dg Foot Complete Right  08/05/2014  CLINICAL DATA:  Right foot pain and swelling. EXAM: RIGHT FOOT COMPLETE - 3+ VIEW COMPARISON:  None. FINDINGS: There is no evidence of fracture or dislocation. There is no evidence of arthropathy or other focal bone abnormality. Soft tissues are unremarkable. IMPRESSION: Negative. Electronically Signed   By: Maisie Fushomas  Register   On: 08/05/2014 10:24       Assessment & Plan:   CPX/z00.00- Patient has been counseled on age-appropriate routine health concerns for screening and prevention. These are reviewed and up-to-date. Immunizations are up-to-date or declined. Labs and ECG (06/2013) reviewed.  Problem List Items Addressed This Visit    Obese    Wt Readings from Last 3 Encounters:  02/24/15 175 lb 8 oz (79.606 kg)  08/05/14 179 lb 8 oz (81.421 kg)  07/06/14 179 lb 12 oz (81.534 kg)   Body mass index is 31.1 kg/(m^2). The patient is asked to make an attempt to improve diet and exercise patterns to aid in medical management of this problem.        Other Visit Diagnoses    Routine general medical examination at a health care facility    -  Primary        Rene PaciValerie Meka Lewan, MD

## 2015-02-24 NOTE — Progress Notes (Signed)
Pre visit review using our clinic review tool, if applicable. No additional management support is needed unless otherwise documented below in the visit note. 

## 2015-02-24 NOTE — Assessment & Plan Note (Signed)
Wt Readings from Last 3 Encounters:  02/24/15 175 lb 8 oz (79.606 kg)  08/05/14 179 lb 8 oz (81.421 kg)  07/06/14 179 lb 12 oz (81.534 kg)   Body mass index is 31.1 kg/(m^2). The patient is asked to make an attempt to improve diet and exercise patterns to aid in medical management of this problem.

## 2015-02-24 NOTE — Patient Instructions (Addendum)
It was good to see you today.  We have reviewed your prior records including labs and tests today  Health Maintenance reviewed - all recommended immunizations and age-appropriate screenings are up-to-date.  Medications reviewed and updated, no changes recommended at this time.  Work on lifestyle changes as discussed (low fat, low carb, increased protein diet; improved exercise efforts; weight loss) to control sugar, blood pressure and cholesterol levels and/or reduce risk of developing other medical problems. Look into [email or web address].[email or web address] or other type of food journal to assist you in this process.  Please schedule followup in 12 months for annual exam and labs, call sooner if problems.  Health Maintenance, Female Adopting a healthy lifestyle and getting preventive care can go a long way to promote health and wellness. Talk with your health care provider about what schedule of regular examinations is right for you. This is a good chance for you to check in with your provider about disease prevention and staying healthy. In between checkups, there are plenty of things you can do on your own. Experts have done a lot of research about which lifestyle changes and preventive measures are most likely to keep you healthy. Ask your health care provider for more information. WEIGHT AND DIET  Eat a healthy diet  Be sure to include plenty of vegetables, fruits, low-fat dairy products, and lean protein.  Do not eat a lot of foods high in solid fats, added sugars, or salt.  Get regular exercise. This is one of the most important things you can do for your health.  Most adults should exercise for at least 150 minutes each week. The exercise should increase your heart rate and make you sweat (moderate-intensity exercise).  Most adults should also do strengthening exercises at least twice a week. This is in addition to the moderate-intensity exercise.  Maintain a healthy weight  Body mass index (BMI) is a  measurement that can be used to identify possible weight problems. It estimates body fat based on height and weight. Your health care provider can help determine your BMI and help you achieve or maintain a healthy weight.  For females 55 years of age and older:   A BMI below 18.5 is considered underweight.  A BMI of 18.5 to 24.9 is normal.  A BMI of 25 to 29.9 is considered overweight.  A BMI of 30 and above is considered obese.  Watch levels of cholesterol and blood lipids  You should start having your blood tested for lipids and cholesterol at 39 years of age, then have this test every 5 years.  You may need to have your cholesterol levels checked more often if:  Your lipid or cholesterol levels are high.  You are older than 39 years of age.  You are at high risk for heart disease.  CANCER SCREENING   Lung Cancer  Lung cancer screening is recommended for adults 32-15 years old who are at high risk for lung cancer because of a history of smoking.  A yearly low-dose CT scan of the lungs is recommended for people who:  Currently smoke.  Have quit within the past 15 years.  Have at least a 30-pack-year history of smoking. A pack year is smoking an average of one pack of cigarettes a day for 1 year.  Yearly screening should continue until it has been 15 years since you quit.  Yearly screening should stop if you develop a health problem that would prevent you from having lung cancer treatment.  Breast Cancer  Practice breast self-awareness. This means understanding how your breasts normally appear and feel.  It also means doing regular breast self-exams. Let your health care provider know about any changes, no matter how small.  If you are in your 20s or 30s, you should have a clinical breast exam (CBE) by a health care provider every 1-3 years as part of a regular health exam.  If you are 65 or older, have a CBE every year. Also consider having a breast X-ray  (mammogram) every year.  If you have a family history of breast cancer, talk to your health care provider about genetic screening.  If you are at high risk for breast cancer, talk to your health care provider about having an MRI and a mammogram every year.  Breast cancer gene (BRCA) assessment is recommended for women who have family members with BRCA-related cancers. BRCA-related cancers include:  Breast.  Ovarian.  Tubal.  Peritoneal cancers.  Results of the assessment will determine the need for genetic counseling and BRCA1 and BRCA2 testing. Cervical Cancer Your health care provider may recommend that you be screened regularly for cancer of the pelvic organs (ovaries, uterus, and vagina). This screening involves a pelvic examination, including checking for microscopic changes to the surface of your cervix (Pap test). You may be encouraged to have this screening done every 3 years, beginning at age 59.  For women ages 28-65, health care providers may recommend pelvic exams and Pap testing every 3 years, or they may recommend the Pap and pelvic exam, combined with testing for human papilloma virus (HPV), every 5 years. Some types of HPV increase your risk of cervical cancer. Testing for HPV may also be done on women of any age with unclear Pap test results.  Other health care providers may not recommend any screening for nonpregnant women who are considered low risk for pelvic cancer and who do not have symptoms. Ask your health care provider if a screening pelvic exam is right for you.  If you have had past treatment for cervical cancer or a condition that could lead to cancer, you need Pap tests and screening for cancer for at least 20 years after your treatment. If Pap tests have been discontinued, your risk factors (such as having a new sexual partner) need to be reassessed to determine if screening should resume. Some women have medical problems that increase the chance of getting  cervical cancer. In these cases, your health care provider may recommend more frequent screening and Pap tests. Colorectal Cancer  This type of cancer can be detected and often prevented.  Routine colorectal cancer screening usually begins at 39 years of age and continues through 39 years of age.  Your health care provider may recommend screening at an earlier age if you have risk factors for colon cancer.  Your health care provider may also recommend using home test kits to check for hidden blood in the stool.  A small camera at the end of a tube can be used to examine your colon directly (sigmoidoscopy or colonoscopy). This is done to check for the earliest forms of colorectal cancer.  Routine screening usually begins at age 53.  Direct examination of the colon should be repeated every 5-10 years through 39 years of age. However, you may need to be screened more often if early forms of precancerous polyps or small growths are found. Skin Cancer  Check your skin from head to toe regularly.  Tell your health care provider about any  new moles or changes in moles, especially if there is a change in a mole's shape or color.  Also tell your health care provider if you have a mole that is larger than the size of a pencil eraser.  Always use sunscreen. Apply sunscreen liberally and repeatedly throughout the day.  Protect yourself by wearing long sleeves, pants, a wide-brimmed hat, and sunglasses whenever you are outside. HEART DISEASE, DIABETES, AND HIGH BLOOD PRESSURE   High blood pressure causes heart disease and increases the risk of stroke. High blood pressure is more likely to develop in:  People who have blood pressure in the high end of the normal range (130-139/85-89 mm Hg).  People who are overweight or obese.  People who are African American.  If you are 43-33 years of age, have your blood pressure checked every 3-5 years. If you are 8 years of age or older, have your blood  pressure checked every year. You should have your blood pressure measured twice--once when you are at a hospital or clinic, and once when you are not at a hospital or clinic. Record the average of the two measurements. To check your blood pressure when you are not at a hospital or clinic, you can use:  An automated blood pressure machine at a pharmacy.  A home blood pressure monitor.  If you are between 52 years and 35 years old, ask your health care provider if you should take aspirin to prevent strokes.  Have regular diabetes screenings. This involves taking a blood sample to check your fasting blood sugar level.  If you are at a normal weight and have a low risk for diabetes, have this test once every three years after 39 years of age.  If you are overweight and have a high risk for diabetes, consider being tested at a younger age or more often. PREVENTING INFECTION  Hepatitis B  If you have a higher risk for hepatitis B, you should be screened for this virus. You are considered at high risk for hepatitis B if:  You were born in a country where hepatitis B is common. Ask your health care provider which countries are considered high risk.  Your parents were born in a high-risk country, and you have not been immunized against hepatitis B (hepatitis B vaccine).  You have HIV or AIDS.  You use needles to inject street drugs.  You live with someone who has hepatitis B.  You have had sex with someone who has hepatitis B.  You get hemodialysis treatment.  You take certain medicines for conditions, including cancer, organ transplantation, and autoimmune conditions. Hepatitis C  Blood testing is recommended for:  Everyone born from [birth year] through 1965.  Anyone with known risk factors for hepatitis C. Sexually transmitted infections (STIs)  You should be screened for sexually transmitted infections (STIs) including gonorrhea and chlamydia if:  You are sexually active and are  younger than 39 years of age.  You are older than 39 years of age and your health care provider tells you that you are at risk for this type of infection.  Your sexual activity has changed since you were last screened and you are at an increased risk for chlamydia or gonorrhea. Ask your health care provider if you are at risk.  If you do not have HIV, but are at risk, it may be recommended that you take a prescription medicine daily to prevent HIV infection. This is called pre-exposure prophylaxis (PrEP). You are considered at risk  if:  You are sexually active and do not regularly use condoms or know the HIV status of your partner(s).  You take drugs by injection.  You are sexually active with a partner who has HIV. Talk with your health care provider about whether you are at high risk of being infected with HIV. If you choose to begin PrEP, you should first be tested for HIV. You should then be tested every 3 months for as long as you are taking PrEP.  PREGNANCY   If you are premenopausal and you may become pregnant, ask your health care provider about preconception counseling.  If you may become pregnant, take 400 to 800 micrograms (mcg) of folic acid every day.  If you want to prevent pregnancy, talk to your health care provider about birth control (contraception). OSTEOPOROSIS AND MENOPAUSE   Osteoporosis is a disease in which the bones lose minerals and strength with aging. This can result in serious bone fractures. Your risk for osteoporosis can be identified using a bone density scan.  If you are 60 years of age or older, or if you are at risk for osteoporosis and fractures, ask your health care provider if you should be screened.  Ask your health care provider whether you should take a calcium or vitamin D supplement to lower your risk for osteoporosis.  Menopause may have certain physical symptoms and risks.  Hormone replacement therapy may reduce some of these symptoms and  risks. Talk to your health care provider about whether hormone replacement therapy is right for you.  HOME CARE INSTRUCTIONS   Schedule regular health, dental, and eye exams.  Stay current with your immunizations.   Do not use any tobacco products including cigarettes, chewing tobacco, or electronic cigarettes.  If you are pregnant, do not drink alcohol.  If you are breastfeeding, limit how much and how often you drink alcohol.  Limit alcohol intake to no more than 1 drink per day for nonpregnant women. One drink equals 12 ounces of beer, 5 ounces of wine, or 1 ounces of hard liquor.  Do not use street drugs.  Do not share needles.  Ask your health care provider for help if you need support or information about quitting drugs.  Tell your health care provider if you often feel depressed.  Tell your health care provider if you have ever been abused or do not feel safe at home.   This information is not intended to replace advice given to you by your health care provider. Make sure you discuss any questions you have with your health care provider.   Document Released: 11/06/2010 Document Revised: 05/14/2014 Document Reviewed: 03/25/2013 Elsevier Interactive Patient Education 2016 [employer]. Exercising to [employer] Exercising can help you to lose weight. In order to lose weight through exercise, you need to do vigorous-intensity exercise. You can tell that you are exercising with vigorous intensity if you are breathing very hard and fast and cannot hold a conversation while exercising. Moderate-intensity exercise helps to maintain your current weight. You can tell that you are exercising at a moderate level if you have a higher heart rate and faster breathing, but you are still able to hold a conversation. HOW OFTEN SHOULD I EXERCISE? Choose an activity that you enjoy and set realistic goals. Your health care provider can help you to make an activity plan that works for you.  Exercise regularly as directed by your health care provider. This may include:  Doing resistance training twice each week,  such as:  Push-ups.  Sit-ups.  Lifting weights.  Using resistance bands.  Doing a given intensity of exercise for a given amount of time. Choose from these options:  150 minutes of moderate-intensity exercise every week.  75 minutes of vigorous-intensity exercise every week.  A mix of moderate-intensity and vigorous-intensity exercise every week. Children, pregnant women, people who are out of shape, people who are overweight, and older adults may need to consult a health care provider for individual recommendations. If you have any sort of medical condition, be sure to consult your health care provider before starting a new exercise program. WHAT ARE SOME ACTIVITIES THAT CAN HELP ME TO LOSE WEIGHT?   Walking at a rate of at least 4.5 miles an hour.  Jogging or running at a rate of 5 miles per hour.  Biking at a rate of at least 10 miles per hour.  Lap swimming.  Roller-skating or in-line skating.  Cross-country skiing.  Vigorous competitive sports, such as football, basketball, and soccer.  Jumping rope.  Aerobic dancing. HOW CAN I BE MORE ACTIVE IN MY DAY-TO-DAY ACTIVITIES?  Use the stairs instead of the elevator.  Take a walk during your lunch break.  If you drive, park your car farther away from work or school.  If you take public transportation, get off one stop early and walk the rest of the way.  Make all of your phone calls while standing up and walking around.  Get up, stretch, and walk around every 30 minutes throughout the day. WHAT GUIDELINES SHOULD I FOLLOW WHILE EXERCISING?  Do not exercise so much that you hurt yourself, feel dizzy, or get very short of breath.  Consult your health care provider prior to starting a new exercise program.  Wear comfortable clothes and shoes with good support.  Drink plenty of water while  you exercise to prevent dehydration or heat stroke. Body water is lost during exercise and must be replaced.  Work out until you breathe faster and your heart beats faster.   This information is not intended to replace advice given to you by your health care provider. Make sure you discuss any questions you have with your health care provider.   Document Released: 05/26/2010 Document Revised: 05/14/2014 Document Reviewed: 09/24/2013 Elsevier Interactive Patient Education [employer].

## 2015-03-28 ENCOUNTER — Other Ambulatory Visit: Payer: Self-pay | Admitting: Internal Medicine

## 2015-04-24 ENCOUNTER — Encounter: Payer: Self-pay | Admitting: Internal Medicine

## 2015-04-25 MED ORDER — VENLAFAXINE HCL ER 75 MG PO CP24: 75.0000 mg | ORAL_CAPSULE | Freq: Every day | ORAL | Status: DC

## 2015-04-25 NOTE — Telephone Encounter (Signed)
Pended refill. Sending you message. Can respond if you need me to.

## 2015-05-16 ENCOUNTER — Encounter: Payer: Self-pay | Admitting: Internal Medicine

## 2015-05-16 LAB — QUANTIFERON TB GOLD ASSAY (BLOOD): QUANTIFERON TB GOLD: POSITIVE

## 2015-05-19 NOTE — Telephone Encounter (Signed)
i am sending result notes from dr leschbers in-basket---can you please review and advise, thanks

## 2015-07-18 IMAGING — CR DG FOOT COMPLETE 3+V*R*
3 series · 3 of 3 positions shown · non-contrast
Comparison: None.

CLINICAL DATA: Right foot pain and swelling.

EXAM:
RIGHT FOOT COMPLETE - 3+ VIEW

[view not recorded (1 of 3)]
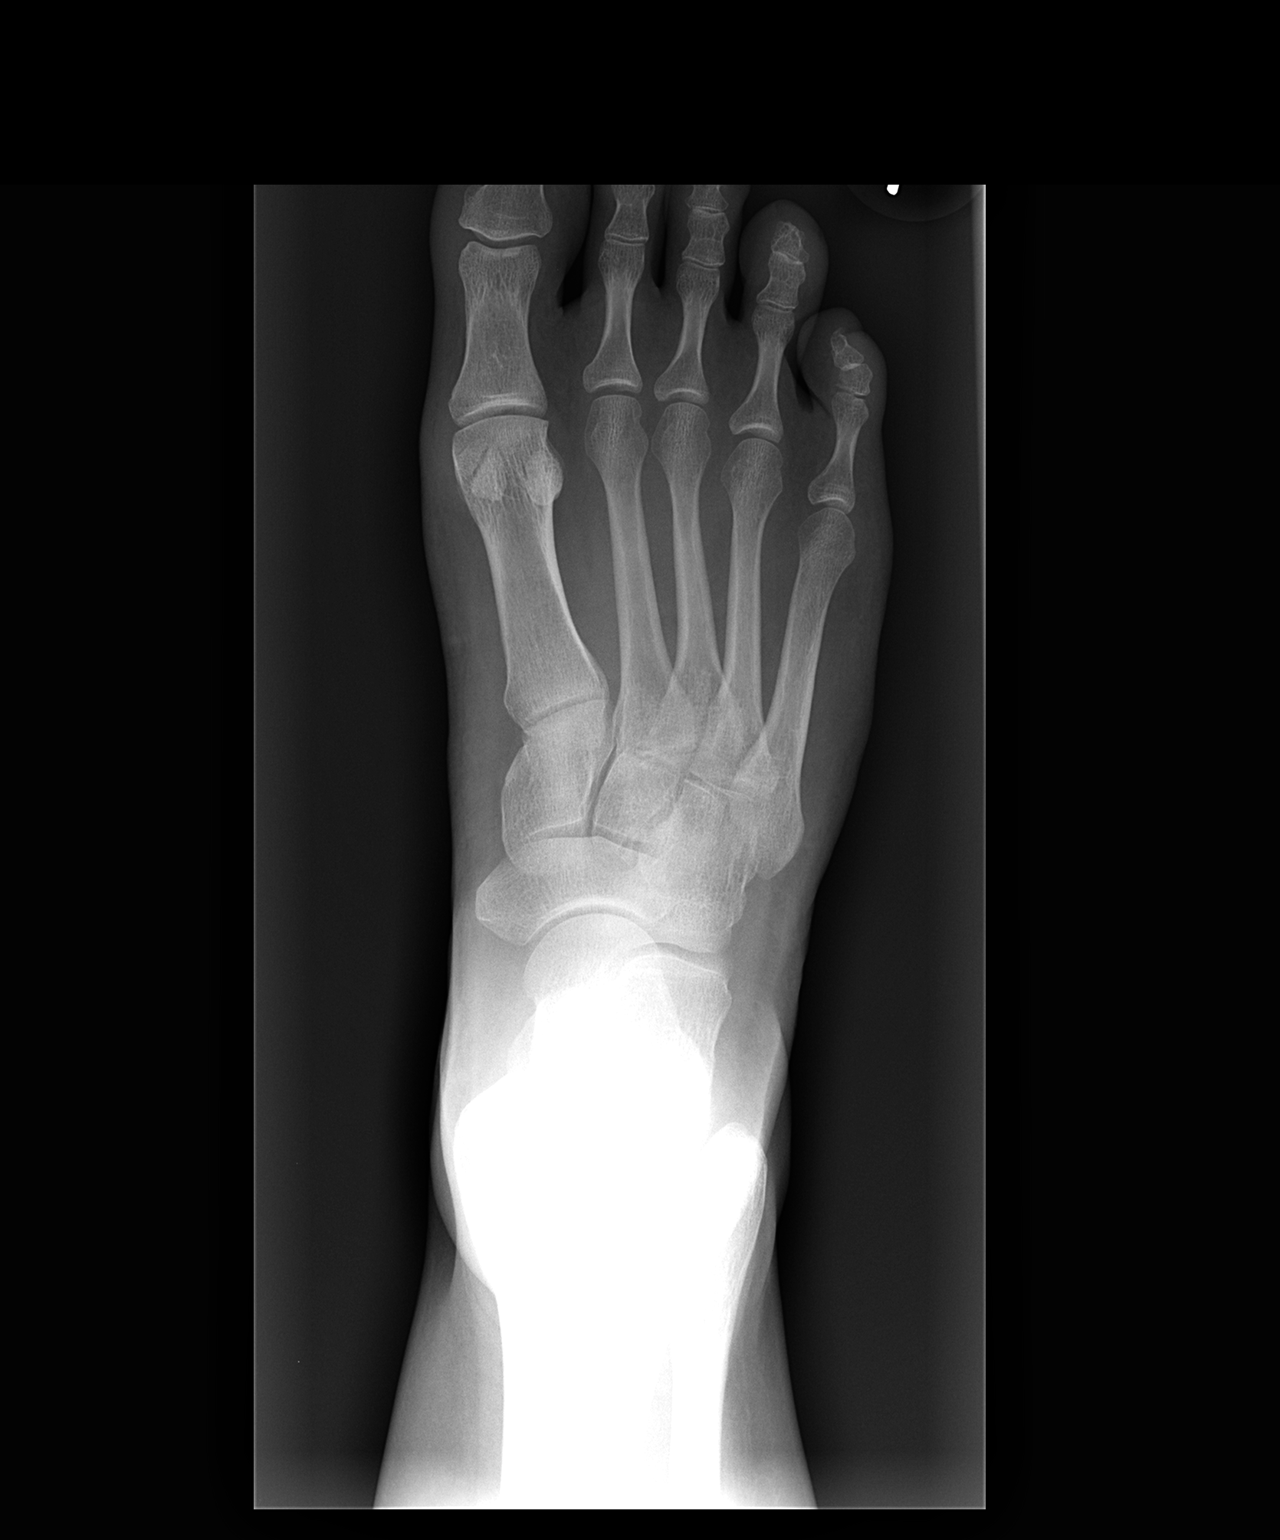

[view not recorded (2 of 3)]
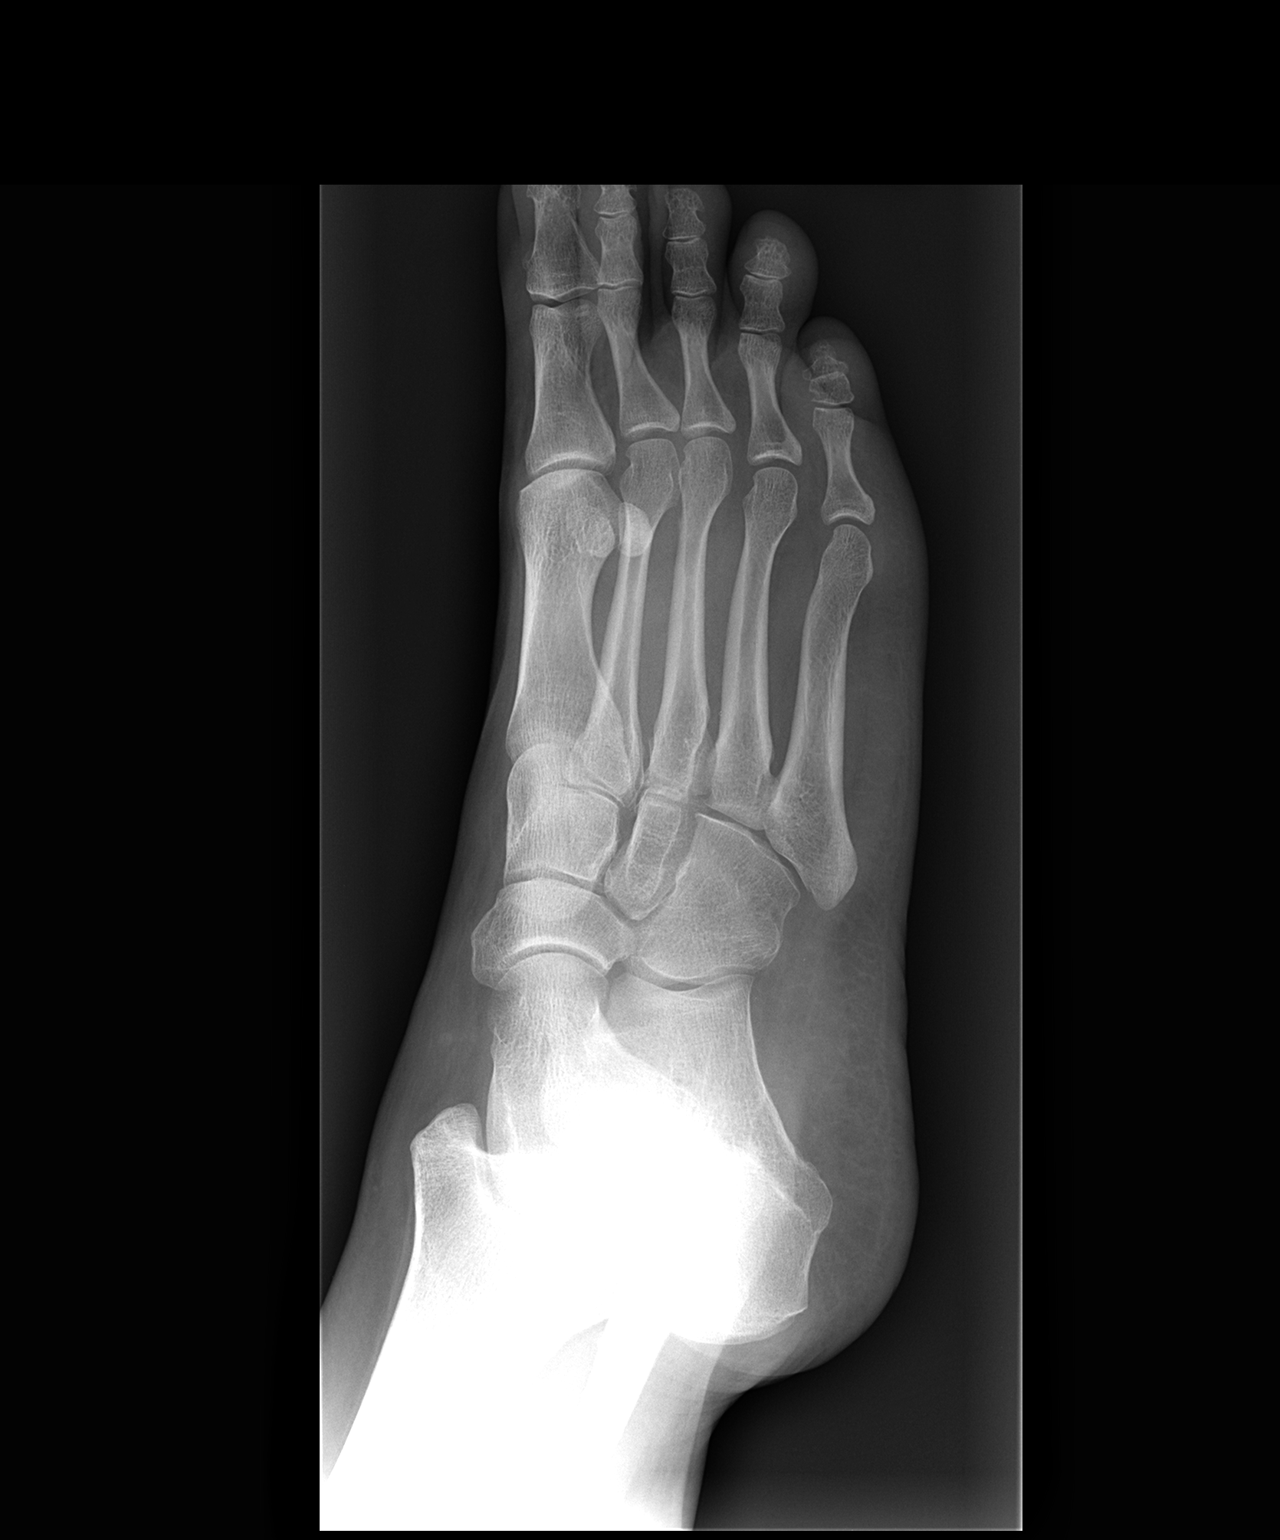

[view not recorded (3 of 3)]
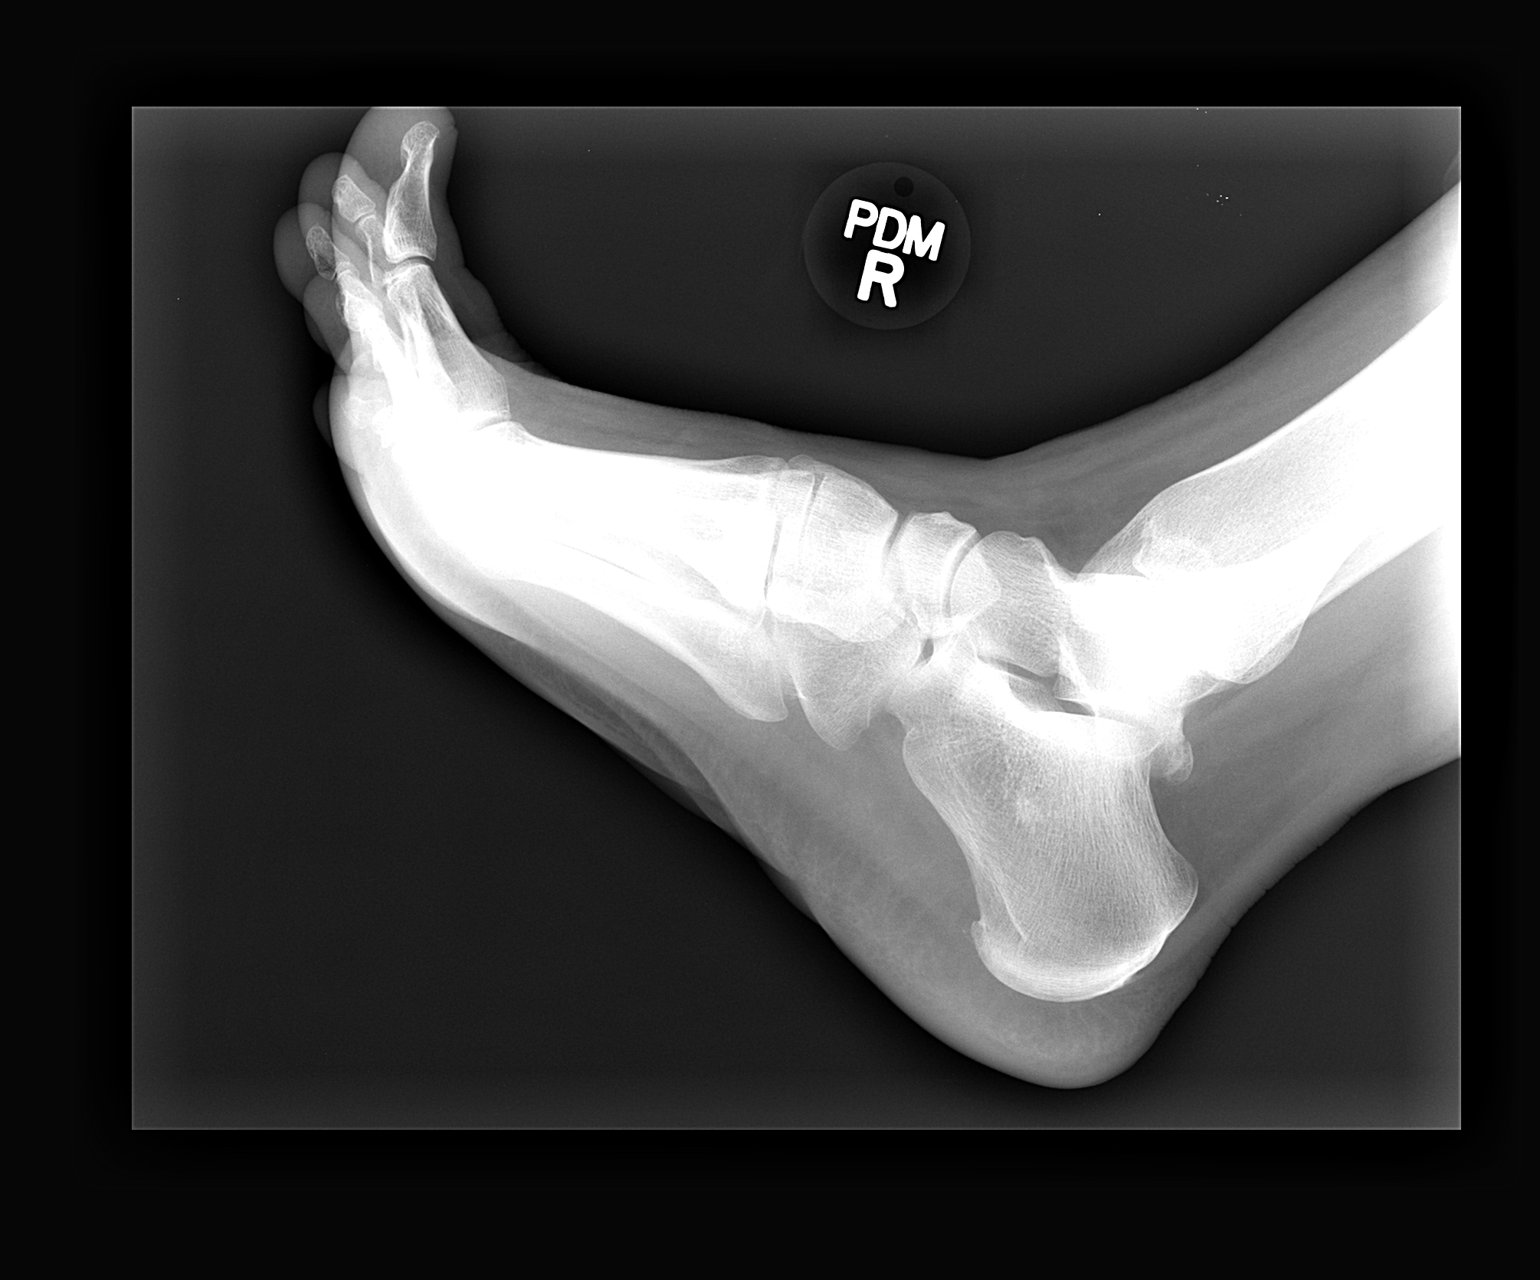

[3 of 3 positions shown; findings below may reference images not displayed]

FINDINGS: There is no evidence of fracture or dislocation. There is no
evidence of arthropathy or other focal bone abnormality. Soft
tissues are unremarkable.
IMPRESSION: Negative.

## 2015-10-10 ENCOUNTER — Ambulatory Visit (INDEPENDENT_AMBULATORY_CARE_PROVIDER_SITE_OTHER): Payer: Commercial Managed Care - PPO | Admitting: Internal Medicine

## 2015-10-10 ENCOUNTER — Encounter: Payer: Self-pay | Admitting: Internal Medicine

## 2015-10-10 VITALS — BP 118/80 | HR 74 | Temp 98.7°F | Resp 16 | Ht 62.5 in | Wt 173.0 lb

## 2015-10-10 DIAGNOSIS — F329 Major depressive disorder, single episode, unspecified: Secondary | ICD-10-CM | POA: Diagnosis not present

## 2015-10-10 DIAGNOSIS — F32A Depression, unspecified: Secondary | ICD-10-CM

## 2015-10-10 DIAGNOSIS — Z0000 Encounter for general adult medical examination without abnormal findings: Secondary | ICD-10-CM | POA: Diagnosis not present

## 2015-10-10 MED ORDER — VENLAFAXINE HCL ER 37.5 MG PO CP24: 37.5000 mg | ORAL_CAPSULE | Freq: Every day | ORAL | Status: DC

## 2015-10-10 MED ORDER — VENLAFAXINE HCL ER 75 MG PO CP24: 75.0000 mg | ORAL_CAPSULE | Freq: Every day | ORAL | Status: DC

## 2015-10-10 NOTE — Assessment & Plan Note (Signed)
Taking effexor 75 mg now and will increase to 112 mg daily (75 mg plus 37.5 mg daily). If no benefit in 3-4 weeks will increase to 150 mg daily effexor. No side effects.

## 2015-10-10 NOTE — Patient Instructions (Signed)
We have sent in the effexor 75 mg and also 37.5 mg so you can take 1 of both to equal 112.5 mg daily of the effexor.   Give that 2-3 weeks and if you are not noticing a benefit send us a mychart message and the next higher dose is 150 mg daily.   We have put in the labs so some time you are fasting this fall come back to do those.

## 2015-10-10 NOTE — Progress Notes (Signed)
Pre visit review using our clinic review tool, if applicable. No additional management support is needed unless otherwise documented below in the visit note. 

## 2015-10-10 NOTE — Progress Notes (Signed)
   Subjective:    Patient ID: [name], female    DOB: Sep 30, 1975, 40 y.o.   MRN: [id number]  HPI The patient is a 40 YO female coming in for follow up of her depression. 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 Denies SI/HI. Does exercise with walking several times per week.   Review of Systems  Constitutional: Negative.   Respiratory: Negative.   Cardiovascular: Negative.   Gastrointestinal: Negative.   Musculoskeletal: Negative.   Neurological: Negative.   Psychiatric/Behavioral: Positive for dysphoric mood and decreased concentration. Negative for hallucinations, behavioral problems, sleep disturbance and agitation. The patient is not nervous/anxious.       Objective:   Physical Exam  Constitutional: She is oriented to person, place, and time. She appears well-developed and well-nourished.  Overweight  HENT:  Head: Normocephalic and atraumatic.  Eyes: EOM are normal.  Neck: Normal range of motion.  Cardiovascular: Normal rate and regular rhythm.   Pulmonary/Chest: Effort normal and breath sounds normal. No respiratory distress. She has no wheezes.  Abdominal: Soft. Bowel sounds are normal. She exhibits no distension. There is no tenderness. There is no rebound.  Musculoskeletal: She exhibits no edema.  Neurological: She is alert and oriented to person, place, and time.  Skin: Skin is warm and dry.  Psychiatric: She has a normal mood and affect.   Filed Vitals:   10/10/15 1530  BP: 118/80  Pulse: 74  Temp: 98.7 F (37.1 C)  TempSrc: Oral  Resp: 16  Height: 5' 2.5" (1.588 m)  Weight: 173 lb (78.472 kg)  SpO2: 98%      Assessment & Plan:

## 2015-10-24 ENCOUNTER — Encounter: Payer: Self-pay | Admitting: Internal Medicine

## 2015-12-13 ENCOUNTER — Encounter: Payer: Self-pay | Admitting: Internal Medicine

## 2015-12-13 ENCOUNTER — Ambulatory Visit (INDEPENDENT_AMBULATORY_CARE_PROVIDER_SITE_OTHER): Payer: Commercial Managed Care - PPO | Admitting: Internal Medicine

## 2015-12-13 DIAGNOSIS — L989 Disorder of the skin and subcutaneous tissue, unspecified: Secondary | ICD-10-CM

## 2015-12-13 NOTE — Progress Notes (Signed)
Pre visit review using our clinic review tool, if applicable. No additional management support is needed unless otherwise documented below in the visit note. 

## 2015-12-13 NOTE — Progress Notes (Signed)
   Subjective:    Patient ID: Audrey MenghiniSara B Kutner, female    DOB: 02/25/1976, 40 y.o.   MRN: [id number]  HPI The patient is a 40 YO female coming in for skin lesion. Noticed it about 1 week ago on the back of her left leg. It is dark in color. It has not grown since coming up. No tick bite or bug bite recently. She does use sunscreen and is good about covering up when she goes outside. Has some other freckles but none which are dark like this.   Review of Systems  Constitutional: Negative.   Respiratory: Negative.   Cardiovascular: Negative.   Gastrointestinal: Negative.   Musculoskeletal: Negative.   Skin: Positive for color change.  Neurological: Negative.       Objective:   Physical Exam  Constitutional: She is oriented to person, place, and time. She appears well-developed and well-nourished.  Overweight  HENT:  Head: Normocephalic and atraumatic.  Eyes: EOM are normal.  Neck: Normal range of motion.  Cardiovascular: Normal rate and regular rhythm.   Pulmonary/Chest: Effort normal and breath sounds normal. No respiratory distress. She has no wheezes.  Abdominal: Soft. Bowel sounds are normal. She exhibits no distension. There is no tenderness. There is no rebound.  Neurological: She is alert and oriented to person, place, and time.  Skin: Skin is warm and dry.  Pinprick dark lesion on the posterior left calf which is solid. Some other freckles on the legs and arms which are lightly colored.    Vitals:   12/13/15 1603  BP: 108/72  Pulse: 90  Resp: 12  Temp: 99.1 F (37.3 C)  TempSrc: Oral  SpO2: 98%  Weight: 171 lb 12.8 oz (77.9 kg)  Height: 5\' 2"  (1.575 m)      Assessment & Plan:

## 2015-12-14 DIAGNOSIS — L989 Disorder of the skin and subcutaneous tissue, unspecified: Secondary | ICD-10-CM | POA: Insufficient documentation

## 2015-12-14 NOTE — Assessment & Plan Note (Addendum)
Due to the dark coloring needs to be monitored for growth. At this time size is small enough that it does not need biopsy. If growing needs removal. Recheck in about 6 months at physical.

## 2016-01-20 ENCOUNTER — Other Ambulatory Visit (INDEPENDENT_AMBULATORY_CARE_PROVIDER_SITE_OTHER): Payer: Commercial Managed Care - PPO

## 2016-01-20 DIAGNOSIS — Z0000 Encounter for general adult medical examination without abnormal findings: Secondary | ICD-10-CM | POA: Diagnosis not present

## 2016-01-20 LAB — TSH: TSH: 2.05 u[IU]/mL (ref 0.35–4.50)

## 2016-01-20 LAB — COMPREHENSIVE METABOLIC PANEL
ALBUMIN: 3.9 g/dL (ref 3.5–5.2)
ALK PHOS: 60 U/L (ref 39–117)
ALT: 20 U/L (ref 0–35)
AST: 15 U/L (ref 0–37)
BILIRUBIN TOTAL: 0.4 mg/dL (ref 0.2–1.2)
BUN: 12 mg/dL (ref 6–23)
CO2: 27 mEq/L (ref 19–32)
CREATININE: 0.75 mg/dL (ref 0.40–1.20)
Calcium: 8.6 mg/dL (ref 8.4–10.5)
Chloride: 107 mEq/L (ref 96–112)
GFR: 90.77 mL/min (ref >60.00)
Glucose, Bld: 101 mg/dL — ABNORMAL HIGH (ref 70–99)
POTASSIUM: 3.9 meq/L (ref 3.5–5.1)
SODIUM: 141 meq/L (ref 135–145)
TOTAL PROTEIN: 7.1 g/dL (ref 6.0–8.3)

## 2016-01-20 LAB — CBC
HCT: 38.3 % (ref 36.0–46.0)
HEMOGLOBIN: 13.1 g/dL (ref 12.0–15.0)
MCHC: 34.3 g/dL (ref 30.0–36.0)
MCV: 83.5 fl (ref 78.0–100.0)
Platelets: 274 10*3/uL (ref 150.0–400.0)
RBC: 4.59 Mil/uL (ref 3.87–5.11)
RDW: 12.3 % (ref 11.5–15.5)
WBC: 9.6 10*3/uL (ref 4.0–10.5)

## 2016-01-20 LAB — HEMOGLOBIN A1C: HEMOGLOBIN A1C: 5.7 % (ref 4.6–6.5)

## 2016-02-17 ENCOUNTER — Encounter: Payer: Self-pay | Admitting: Internal Medicine

## 2016-02-23 ENCOUNTER — Encounter: Payer: Self-pay | Admitting: Geriatric Medicine

## 2016-06-06 ENCOUNTER — Ambulatory Visit (INDEPENDENT_AMBULATORY_CARE_PROVIDER_SITE_OTHER): Payer: Commercial Managed Care - PPO | Admitting: Internal Medicine

## 2016-06-06 ENCOUNTER — Encounter: Payer: Self-pay | Admitting: Internal Medicine

## 2016-06-06 VITALS — BP 122/80 | HR 77 | Temp 98.6°F | Resp 14 | Ht 62.0 in | Wt 177.0 lb

## 2016-06-06 DIAGNOSIS — R0789 Other chest pain: Secondary | ICD-10-CM

## 2016-06-06 DIAGNOSIS — R002 Palpitations: Secondary | ICD-10-CM

## 2016-06-06 DIAGNOSIS — J3489 Other specified disorders of nose and nasal sinuses: Secondary | ICD-10-CM

## 2016-06-06 DIAGNOSIS — F329 Major depressive disorder, single episode, unspecified: Secondary | ICD-10-CM | POA: Diagnosis not present

## 2016-06-06 MED ORDER — VENLAFAXINE HCL ER 150 MG PO CP24: 150.0000 mg | ORAL_CAPSULE | Freq: Every day | ORAL | 2 refills | Status: DC

## 2016-06-06 NOTE — Patient Instructions (Addendum)
Your nose is likely not broken and you can add a humidifier to your room to help with the dryness.   The EKG is normal and the fluttering is likely not anything serious. If you start having this more often call us back we can order an EKG test that you wear for 48 hours to see if we can capture this episode.   We have sent in the effexor 150 mg for daily. It is okay to use up the 37.5 and the 75 mg capsules.

## 2016-06-06 NOTE — Progress Notes (Signed)
Pre visit review using our clinic review tool, if applicable. No additional management support is needed unless otherwise documented below in the visit note. 

## 2016-06-06 NOTE — Assessment & Plan Note (Signed)
Suspect from some PVCs, checked EKG today which is not significantly different from prior. Do not suspect heart etiology. No family history of early CAD. Suspect more from increase in stress and caffeine intake.

## 2016-06-06 NOTE — Progress Notes (Signed)
   Subjective:    Patient ID: Audrey MenghiniSara B Skarda, female    DOB: 1975-06-29, 41 y.o.   MRN: [id number]  HPI The patient is a 41 YO female coming in for several concerns: she is worried that her nose may be broken (some pain at the bridge for the last 1-2 months, does not remember any incident with injury, no swelling in the last 1-2 months, no significant sinus congestion, some mild blood with wiping rarely) and some heart palpitations and rare chest pain (she has some fluttering with lying forward, does admit to extra stress in her life, sometimes she has some pain for 1-2 seconds which can cause some pressure like sensation in her neck which passes in <1 minute, no sweating, mostly this comes at rest, never during exertion).   Review of Systems  Constitutional: Negative.   HENT: Positive for congestion. Negative for dental problem, drooling, hearing loss, mouth sores, nosebleeds, postnasal drip, rhinorrhea, sinus pain, sinus pressure, sore throat and trouble swallowing.        Nose pain  Eyes: Negative.   Respiratory: Negative.   Cardiovascular: Positive for chest pain and palpitations. Negative for leg swelling.  Gastrointestinal: Negative.   Musculoskeletal: Negative.   Skin: Negative.   Neurological: Negative.   Psychiatric/Behavioral: Positive for decreased concentration and dysphoric mood.      Objective:   Physical Exam  Constitutional: She is oriented to person, place, and time. She appears well-developed and well-nourished.  HENT:  Head: Normocephalic and atraumatic.  Right Ear: External ear normal.  Left Ear: External ear normal.  Nose: Nose normal.  No broken nose on exam.   Eyes: EOM are normal.  Neck: Normal range of motion.  Cardiovascular: Normal rate and regular rhythm.   No murmur heard. Pulmonary/Chest: Effort normal and breath sounds normal.  Abdominal: Soft. She exhibits no distension. There is no tenderness. There is no rebound.  Musculoskeletal: She exhibits no  edema.  Neurological: She is alert and oriented to person, place, and time. Coordination normal.  Skin: Skin is warm and dry.   Vitals:   06/06/16 0835  BP: 122/80  Pulse: 77  Resp: 14  Temp: 98.6 F (37 C)  TempSrc: Oral  SpO2: 99%  Weight: 177 lb (80.3 kg)  Height: 5\' 2"  (1.575 m)      Assessment & Plan:

## 2016-06-08 DIAGNOSIS — R0789 Other chest pain: Secondary | ICD-10-CM | POA: Insufficient documentation

## 2016-06-08 DIAGNOSIS — J3489 Other specified disorders of nose and nasal sinuses: Secondary | ICD-10-CM | POA: Insufficient documentation

## 2016-06-08 NOTE — Assessment & Plan Note (Signed)
Suspect related to stress. EKG without changes. Advised to cut back on caffeine and start some self-care activities to help with stress and routine exercise. No further evaluation needed unless worsening or frequency increasing.

## 2016-06-08 NOTE — Assessment & Plan Note (Signed)
Increase effexor to 150 mg daily for the increased stress and depression recently.

## 2016-06-08 NOTE — Assessment & Plan Note (Signed)
On exam nose is not broken and could be related to her allergic rhinitis.

## 2016-09-04 ENCOUNTER — Other Ambulatory Visit: Payer: Self-pay | Admitting: Obstetrics and Gynecology

## 2016-09-04 DIAGNOSIS — K429 Umbilical hernia without obstruction or gangrene: Secondary | ICD-10-CM

## 2016-09-10 ENCOUNTER — Encounter: Payer: Self-pay | Admitting: Internal Medicine

## 2016-09-11 ENCOUNTER — Ambulatory Visit
Admission: RE | Admit: 2016-09-11 | Discharge: 2016-09-11 | Disposition: A | Discharge status: Home / Self Care | Payer: Commercial Managed Care - PPO | Source: Ambulatory Visit | Attending: Obstetrics and Gynecology | Admitting: Obstetrics and Gynecology

## 2016-09-11 DIAGNOSIS — K429 Umbilical hernia without obstruction or gangrene: Secondary | ICD-10-CM

## 2016-09-11 MED ORDER — IOPAMIDOL (ISOVUE-300) INJECTION 61%
100.0000 mL | Freq: Once | INTRAVENOUS | Status: AC | PRN
  Administered 2016-09-11: 100 mL via INTRAVENOUS

## 2016-09-19 ENCOUNTER — Encounter: Payer: Self-pay | Admitting: Internal Medicine

## 2016-09-25 ENCOUNTER — Encounter: Payer: Self-pay | Admitting: Internal Medicine

## 2016-09-25 ENCOUNTER — Other Ambulatory Visit (INDEPENDENT_AMBULATORY_CARE_PROVIDER_SITE_OTHER): Payer: Commercial Managed Care - PPO

## 2016-09-25 ENCOUNTER — Ambulatory Visit (INDEPENDENT_AMBULATORY_CARE_PROVIDER_SITE_OTHER): Payer: Commercial Managed Care - PPO | Admitting: Internal Medicine

## 2016-09-25 VITALS — BP 118/76 | HR 78 | Temp 98.1°F | Resp 12 | Ht 62.0 in | Wt 181.0 lb

## 2016-09-25 DIAGNOSIS — F329 Major depressive disorder, single episode, unspecified: Secondary | ICD-10-CM | POA: Diagnosis not present

## 2016-09-25 DIAGNOSIS — Z0000 Encounter for general adult medical examination without abnormal findings: Secondary | ICD-10-CM | POA: Diagnosis not present

## 2016-09-25 DIAGNOSIS — N281 Cyst of kidney, acquired: Secondary | ICD-10-CM

## 2016-09-25 DIAGNOSIS — R413 Other amnesia: Secondary | ICD-10-CM | POA: Diagnosis not present

## 2016-09-25 LAB — COMPREHENSIVE METABOLIC PANEL
ALBUMIN: 4.3 g/dL (ref 3.5–5.2)
ALT: 36 U/L — ABNORMAL HIGH (ref 0–35)
AST: 22 U/L (ref 0–37)
Alkaline Phosphatase: 60 U/L (ref 39–117)
BUN: 18 mg/dL (ref 6–23)
CALCIUM: 9.6 mg/dL (ref 8.4–10.5)
CHLORIDE: 102 meq/L (ref 96–112)
CO2: 31 mEq/L (ref 19–32)
Creatinine, Ser: 0.75 mg/dL (ref 0.40–1.20)
GFR: 90.47 mL/min (ref >60.00)
Glucose, Bld: 99 mg/dL (ref 70–99)
POTASSIUM: 4.1 meq/L (ref 3.5–5.1)
SODIUM: 139 meq/L (ref 135–145)
Total Bilirubin: 0.5 mg/dL (ref 0.2–1.2)
Total Protein: 7.3 g/dL (ref 6.0–8.3)

## 2016-09-25 LAB — LIPID PANEL
CHOLESTEROL: 192 mg/dL (ref 0–200)
HDL: 51.5 mg/dL (ref >39.00)
LDL CALC: 124 mg/dL — AB (ref 0–99)
NonHDL: 140.08
TRIGLYCERIDES: 78 mg/dL (ref 0.0–149.0)
Total CHOL/HDL Ratio: 4
VLDL: 15.6 mg/dL (ref 0.0–40.0)

## 2016-09-25 LAB — CBC
HEMATOCRIT: 40.8 % (ref 36.0–46.0)
HEMOGLOBIN: 13.9 g/dL (ref 12.0–15.0)
MCHC: 34 g/dL (ref 30.0–36.0)
MCV: 82.8 fl (ref 78.0–100.0)
Platelets: 252 10*3/uL (ref 150.0–400.0)
RBC: 4.93 Mil/uL (ref 3.87–5.11)
RDW: 12.8 % (ref 11.5–15.5)
WBC: 7 10*3/uL (ref 4.0–10.5)

## 2016-09-25 LAB — HEMOGLOBIN A1C: Hgb A1c MFr Bld: 5.9 % (ref 4.6–6.5)

## 2016-09-25 MED ORDER — VENLAFAXINE HCL ER 225 MG PO TB24: 225.0000 mg | ORAL_TABLET | Freq: Every day | ORAL | 0 refills | Status: DC

## 2016-09-25 NOTE — Patient Instructions (Signed)
We will increase the effexor to 225 mg daily to see if this helps more with the depression.   I suspect that the memory changes are related to the depression and not the true memory capacity.   Also think about doing some counseling to help boost back up your coping skills and the self care activities.   Stress and Stress Management Stress is a normal reaction to life events. It is what you feel when life demands more than you are used to or more than you can handle. Some stress can be useful. For example, the stress reaction can help you catch the last bus of the day, study for a test, or meet a deadline at work. But stress that occurs too often or for too long can cause problems. It can affect your emotional health and interfere with relationships and normal daily activities. Too much stress can weaken your immune system and increase your risk for physical illness. If you already have a medical problem, stress can make it worse. What are the causes? All sorts of life events may cause stress. An event that causes stress for one person may not be stressful for another person. Major life events commonly cause stress. These may be positive or negative. Examples include losing your job, moving into a new home, getting married, having a baby, or losing a loved one. Less obvious life events may also cause stress, especially if they occur day after day or in combination. Examples include working long hours, driving in traffic, caring for children, being in debt, or being in a difficult relationship. What are the signs or symptoms? Stress may cause emotional symptoms including, the following:  Anxiety. This is feeling worried, afraid, on edge, overwhelmed, or out of control.  Anger. This is feeling irritated or impatient.  Depression. This is feeling sad, down, helpless, or guilty.  Difficulty focusing, remembering, or making decisions. Stress may cause physical symptoms, including the  following:  Aches and pains. These may affect your head, neck, back, stomach, or other areas of your body.  Tight muscles or clenched jaw.  Low energy or trouble sleeping. Stress may cause unhealthy behaviors, including the following:  Eating to feel better (overeating) or skipping meals.  Sleeping too little, too much, or both.  Working too much or putting off tasks (procrastination).  Smoking, drinking alcohol, or using drugs to feel better. How is this diagnosed? Stress is diagnosed through an assessment by your health care provider. Your health care provider will ask questions about your symptoms and any stressful life events.Your health care provider will also ask about your medical history and may order blood tests or other tests. Certain medical conditions and medicine can cause physical symptoms similar to stress. Mental illness can cause emotional symptoms and unhealthy behaviors similar to stress. Your health care provider may refer you to a mental health professional for further evaluation. How is this treated? Stress management is the recommended treatment for stress.The goals of stress management are reducing stressful life events and coping with stress in healthy ways. Techniques for reducing stressful life events include the following:  Stress identification. Self-monitor for stress and identify what causes stress for you. These skills may help you to avoid some stressful events.  Time management. Set your priorities, keep a calendar of events, and learn to say "no." These tools can help you avoid making too many commitments. Techniques for coping with stress include the following:  Rethinking the problem. Try to think realistically about stressful  events rather than ignoring them or overreacting. Try to find the positives in a stressful situation rather than focusing on the negatives.  Exercise. Physical exercise can release both physical and emotional tension. The key  is to find a form of exercise you enjoy and do it regularly.  Relaxation techniques. These relax the body and mind. Examples include yoga, meditation, tai chi, biofeedback, deep breathing, progressive muscle relaxation, listening to music, being out in nature, journaling, and other hobbies. Again, the key is to find one or more that you enjoy and can do regularly.  Healthy lifestyle. Eat a balanced diet, get plenty of sleep, and do not smoke. Avoid using alcohol or drugs to relax.  Strong support network. Spend time with family, friends, or other people you enjoy being around.Express your feelings and talk things over with someone you trust. Counseling or talktherapy with a mental health professional may be helpful if you are having difficulty managing stress on your own. Medicine is typically not recommended for the treatment of stress.Talk to your health care provider if you think you need medicine for symptoms of stress. Follow these instructions at home:  Keep all follow-up visits as directed by your health care provider.  Take all medicines as directed by your health care provider. Contact a health care provider if:  Your symptoms get worse or you start having new symptoms.  You feel overwhelmed by your problems and can no longer manage them on your own. Get help right away if:  You feel like hurting yourself or someone else. This information is not intended to replace advice given to you by your health care provider. Make sure you discuss any questions you have with your health care provider. Document Released: 10/17/2000 Document Revised: 09/29/2015 Document Reviewed: 12/16/2012 Elsevier Interactive Patient Education  2017 [employer].

## 2016-09-25 NOTE — Progress Notes (Signed)
   Subjective:    Patient ID: Rockne MenghiniSara B Faison, female    DOB: [birth year], 41 y.o.   MRN: [id number]  HPI The patient is a 41 YO female coming in for worsening depression and anxiety (taking effexor 150 mg daily, still helping some but having more anxiety, less interaction with people, no energy or motivation to move when home, sleeping more during the day and still not rested, denies SI/HI, not in counseling and not doing self-care as she is not motivated to do this), and some concerns with her memory (she is not able to remember names of co-workers, problems with work finding, denies getting lost, sometimes forgets what she was doing), and her recent CT scan (saw her GYN and requested a CT scan to see if she had a hernia around her belly button, she denies pain in the hernia or unable to reduce on her own, found an incidental cyst on kidney which is too small to characterize, she is very worried about this and wants to follow up with this soon).   Review of Systems  Constitutional: Positive for activity change, appetite change, fatigue and unexpected weight change. Negative for chills and fever.  HENT: Negative.   Eyes: Negative.   Respiratory: Positive for chest tightness. Negative for cough, shortness of breath and wheezing.   Cardiovascular: Negative.   Gastrointestinal: Negative.   Musculoskeletal: Negative.   Skin: Negative.   Neurological: Negative.   Psychiatric/Behavioral: Positive for agitation, decreased concentration, dysphoric mood and sleep disturbance. Negative for hallucinations, self-injury and suicidal ideas. The patient is nervous/anxious. The patient is not hyperactive.       Objective:   Physical Exam  Constitutional: She is oriented to person, place, and time. She appears well-developed and well-nourished.  HENT:  Head: Normocephalic and atraumatic.  Eyes: EOM are normal.  Neck: Normal range of motion.  Cardiovascular: Normal rate and regular rhythm.   Pulmonary/Chest:  Effort normal. No respiratory distress. She has no wheezes. She has no rales.  Abdominal: Soft. She exhibits no distension. There is no tenderness. There is no rebound.  Musculoskeletal: She exhibits no edema.  Neurological: She is alert and oriented to person, place, and time. Coordination normal.  Skin: Skin is warm and dry.  Psychiatric:  Concerned during visit and somewhat distracted   Vitals:   09/25/16 0825  BP: 118/76  Pulse: 78  Resp: 12  Temp: 98.1 F (36.7 C)  TempSrc: Oral  SpO2: 99%  Weight: 181 lb (82.1 kg)  Height: 5\' 2"  (1.575 m)      Assessment & Plan:

## 2016-09-27 DIAGNOSIS — N281 Cyst of kidney, acquired: Secondary | ICD-10-CM | POA: Insufficient documentation

## 2016-09-27 DIAGNOSIS — R413 Other amnesia: Secondary | ICD-10-CM | POA: Insufficient documentation

## 2016-09-27 NOTE — Assessment & Plan Note (Signed)
She is not doing well and needs more treatment. Increase effexor to 225 mg daily as she is concerned about switching in case the new agent does not work at all. Encouraged her to get some counseling to help with coping skills. Encouraged to resume self-care activities to help with stress management. She will work on this and let us know in about 2-4 weeks if any improvement with dosing change.

## 2016-09-27 NOTE — Assessment & Plan Note (Signed)
Suspect due to worsening depression. See depression for treatment of that. Will reassess once depression controlled. Reassurance given that she is not getting early alzheimer's.

## 2016-09-27 NOTE — Assessment & Plan Note (Signed)
Reassurance given that she likely did not need CT scan to begin with as hernias are benign. Renal cyst is incidental finding and is not significant and does not need follow up. She is not very happy with this but further reassurance given and she grudgingly agrees.

## 2016-12-04 ENCOUNTER — Encounter: Payer: Self-pay | Admitting: Internal Medicine

## 2016-12-24 ENCOUNTER — Other Ambulatory Visit: Payer: Self-pay | Admitting: Internal Medicine

## 2017-02-06 ENCOUNTER — Encounter: Payer: Self-pay | Admitting: Internal Medicine

## 2017-08-04 ENCOUNTER — Emergency Department (HOSPITAL_COMMUNITY)
Admission: EM | Admit: 2017-08-04 | Discharge: 2017-08-04 | Disposition: A | Discharge status: Home / Self Care | Payer: PRIVATE HEALTH INSURANCE | Attending: Emergency Medicine | Admitting: Emergency Medicine

## 2017-08-04 ENCOUNTER — Other Ambulatory Visit: Payer: Self-pay

## 2017-08-04 ENCOUNTER — Encounter (HOSPITAL_COMMUNITY): Payer: Self-pay | Admitting: *Deleted

## 2017-08-04 ENCOUNTER — Emergency Department (HOSPITAL_COMMUNITY): Payer: PRIVATE HEALTH INSURANCE

## 2017-08-04 DIAGNOSIS — Z79899 Other long term (current) drug therapy: Secondary | ICD-10-CM | POA: Insufficient documentation

## 2017-08-04 DIAGNOSIS — R079 Chest pain, unspecified: Secondary | ICD-10-CM | POA: Diagnosis present

## 2017-08-04 DIAGNOSIS — F329 Major depressive disorder, single episode, unspecified: Secondary | ICD-10-CM | POA: Diagnosis not present

## 2017-08-04 DIAGNOSIS — R0789 Other chest pain: Secondary | ICD-10-CM | POA: Insufficient documentation

## 2017-08-04 LAB — I-STAT BETA HCG BLOOD, ED (MC, WL, AP ONLY)

## 2017-08-04 LAB — BASIC METABOLIC PANEL
Anion gap: 12 (ref 5–15)
BUN: 13 mg/dL (ref 6–20)
CALCIUM: 9 mg/dL (ref 8.9–10.3)
CO2: 21 mmol/L — ABNORMAL LOW (ref 22–32)
Chloride: 102 mmol/L (ref 101–111)
Creatinine, Ser: 0.67 mg/dL (ref 0.44–1.00)
Glucose, Bld: 100 mg/dL — ABNORMAL HIGH (ref 65–99)
Potassium: 3.8 mmol/L (ref 3.5–5.1)
Sodium: 135 mmol/L (ref 135–145)

## 2017-08-04 LAB — CBC
HCT: 43.1 % (ref 36.0–46.0)
Hemoglobin: 13.8 g/dL (ref 12.0–15.0)
MCH: 27.4 pg (ref 26.0–34.0)
MCHC: 32 g/dL (ref 30.0–36.0)
MCV: 85.7 fL (ref 78.0–100.0)
Platelets: 281 10*3/uL (ref 150–400)
RBC: 5.03 MIL/uL (ref 3.87–5.11)
RDW: 12.5 % (ref 11.5–15.5)
WBC: 8.9 10*3/uL (ref 4.0–10.5)

## 2017-08-04 LAB — I-STAT TROPONIN, ED
TROPONIN I, POC: 0 ng/mL (ref 0.00–0.08)
TROPONIN I, POC: 0 ng/mL (ref 0.00–0.08)

## 2017-08-04 MED ORDER — OMEPRAZOLE 20 MG PO CPDR: 20.0000 mg | DELAYED_RELEASE_CAPSULE | Freq: Every day | ORAL | 0 refills | Status: AC

## 2017-08-04 NOTE — ED Triage Notes (Signed)
Pt states yesterday at 1100 had a weird chest pain and started at bottom of sternum and radiated up into jaw and face.  Happened twice and last 5 min each time.  Pt still felt a pressure in left neck and jaw, and left arm feels funny

## 2017-08-04 NOTE — ED Provider Notes (Signed)
[facility]   CSN: [id number] Arrival date & time: 08/04/17  1146     History   Chief Complaint Chief Complaint  Patient presents with  . Chest Pain    HPI Rockne MenghiniSara B Moore is a 42 y.o. female.  The history is provided by the patient.  Chest Pain   This is a new problem. The current episode started yesterday. The problem occurs daily. The problem has been gradually improving. The pain is associated with rest. Pain location: L chest. The pain is mild. The quality of the pain is described as heavy and dull. The pain radiates to the left shoulder, left neck and left jaw. Duration of episode(s) is 2 days. Pertinent negatives include no abdominal pain, no back pain, no cough, no diaphoresis, no exertional chest pressure, no fever, no irregular heartbeat, no lower extremity edema, no nausea, no near-syncope, no palpitations, no shortness of breath, no sputum production and no vomiting.  Her past medical history is significant for anxiety/panic attacks.  Pertinent negatives for past medical history include no COPD, no CHF, no diabetes, no DVT, no hyperlipidemia, no hypertension, no MI, no PE, no seizures and no strokes.  -Patient does note increased stress recently related to switching jobs.  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  Past Medical History:  Diagnosis Date  . ALLERGIC RHINITIS   . DEPRESSION   . GERD   . Headache(784.0)   . IRITIS 2013   s/p injection in R eye  . UNSPECIFIED OPTIC NEURITIS     Patient Active Problem List   Diagnosis Date Noted  . Memory change 09/27/2016  . Renal cyst 09/27/2016  . Atypical chest pain 06/08/2016  . Skin lesion 12/14/2015  . Obese 02/24/2015  . Depression 08/30/2009  . ALLERGIC RHINITIS 08/30/2009  . GERD 08/30/2009    Past Surgical History:  Procedure Laterality Date  . BREAST SURGERY  1993   (L) breast expander  . CESAREAN  SECTION  04/10/2011   Procedure: CESAREAN SECTION;  Surgeon: Lenoard Adenichard J Taavon, MD;  Location: [facility];  Service: Gynecology;  Laterality: N/A;  Primary Cesarean Section with birth of baby boy @ 2121  . Left breast implant  1995   due to [place]      OB History    Gravida  3   Para  1   Term  1   Preterm  0   AB  1   Living  1     SAB  1   TAB  0   Ectopic  0   Multiple  0   Live Births  1            Home Medications    Prior to Admission medications   Medication Sig Start Date End Date Taking? Authorizing Provider  Calcium Carbonate-Vitamin D (CALCIUM-D PO) Take 1 tablet by mouth at bedtime.   Yes [provider]  docusate sodium (COLACE) 100 MG capsule Take 100 mg by mouth at bedtime as needed (constipation).    Yes [provider]  Multiple Vitamins-Minerals (HAIR/SKIN/NAILS/BIOTIN PO) Take 2 tablets by mouth at bedtime.   Yes [provider]  Multiple Vitamins-Minerals (MULTIVITAMIN GUMMIES ADULT) CHEW Chew 2 tablets by mouth at bedtime.    Yes [provider]  Venlafaxine HCl 225 MG TB24 TAKE 1 TABLET (225 MG TOTAL) BY MOUTH DAILY. Patient taking differently: Take 225 mg  by mouth at bedtime.  12/24/16  Yes Myrlene Broker, MD  omeprazole (PRILOSEC) 20 MG capsule Take 1 capsule (20 mg total) by mouth daily. 08/04/17 09/03/17  Dwana Melena, DO    Family History Family History  Problem Relation Age of Onset  . Diabetes Mother   . Stroke Mother        x's 2 in 2010  . Hyperlipidemia Mother   . Hypertension Mother   . Heart attack Mother   . Atrial fibrillation Father   . Macular degeneration Father   . Clotting disorder Maternal Grandfather 81       d/t clot in heart  . Breast cancer Paternal Aunt   . Diabetes Maternal Grandmother   . Stroke Maternal Uncle   . Kidney failure Maternal Uncle     Social History Social History   Tobacco Use  . Smoking status: Never Smoker  . Smokeless tobacco: Never Used    Substance Use Topics  . Alcohol use: Yes    Alcohol/week: 0.0 oz    Comment: rarely  . Drug use: No     Allergies   Patient has no known allergies.   Review of Systems Review of Systems  Constitutional: Negative for chills, diaphoresis and fever.  HENT: Negative for ear pain and sore throat.   Eyes: Negative for pain and visual disturbance.  Respiratory: Negative for cough, sputum production and shortness of breath.   Cardiovascular: Positive for chest pain. Negative for palpitations and near-syncope.  Gastrointestinal: Negative for abdominal pain, nausea and vomiting.  Genitourinary: Negative for dysuria and hematuria.  Musculoskeletal: Negative for arthralgias and back pain.  Skin: Negative for color change and rash.  Neurological: Negative for seizures, syncope and light-headedness.  Psychiatric/Behavioral: The patient is nervous/anxious.   All other systems reviewed and are negative.    Physical Exam Updated Vital Signs BP (!) 139/93 (BP Location: Left Arm)   Pulse 72   Temp 98.7 F (37.1 C) (Oral)   Resp 16   LMP 07/31/2017   SpO2 100%   Physical Exam  Constitutional: She appears well-developed and well-nourished. No distress.  HENT:  Head: Normocephalic and atraumatic.  Eyes: Conjunctivae are normal.  Neck: Neck supple.  Cardiovascular: Normal rate and regular rhythm.  No murmur heard. Pulses:      Radial pulses are 2+ on the right side, and 2+ on the left side.  Pulmonary/Chest: Effort normal and breath sounds normal. No respiratory distress. She has no decreased breath sounds. She has no wheezes. She has no rhonchi. She has no rales.  Abdominal: Soft. There is no tenderness. There is no rebound.  Musculoskeletal: She exhibits no edema.       Right lower leg: She exhibits no tenderness and no edema.       Left lower leg: She exhibits no tenderness and no edema.  Neurological: She is alert.  Skin: Skin is warm and dry.  Psychiatric: Her mood appears  anxious. She exhibits a depressed mood.  Nursing note and vitals reviewed.    ED Treatments / Results  Labs (all labs ordered are listed, but only abnormal results are displayed) Labs Reviewed  BASIC METABOLIC PANEL - Abnormal; Notable for the following components:      Result Value   CO2 21 (*)    Glucose, Bld 100 (*)    All other components within normal limits  CBC  I-STAT TROPONIN, ED  I-STAT BETA HCG BLOOD, ED ([facility], [facility], [facility] ONLY)  I-STAT TROPONIN, ED  EKG EKG Interpretation  Date/Time:  Sunday August 04 2017 11:58:00 EDT Ventricular Rate:  90 PR Interval:  132 QRS Duration: 80 QT Interval:  368 QTC Calculation: 450 R Axis:   66 Text Interpretation:  Normal sinus rhythm Normal ECG Confirmed by Zavitz, Joshua ([id number]) on 08/04/2017 3:26:26 PM   Radiology Dg Chest 2 View  Result Date: 08/04/2017 CLINICAL DATA:  Chest pain EXAM: CHEST - 2 VIEW COMPARISON:  None. FINDINGS: The heart size and mediastinal contours are within normal limits. Both lungs are clear. The visualized skeletal structures are unremarkable. IMPRESSION: No active cardiopulmonary disease. Electronically Signed   By: Hetal  Patel   On: 08/04/2017 12:53    Procedures Procedures (including critical care time)  Medications Ordered in ED Medications - No data to display   Initial Impression / Assessment and Plan / ED Course  I have reviewed the triage vital signs and the nursing notes.  Pertinent labs & imaging results that were available during my care of the patient were reviewed by me and considered in my medical decision making (see chart for details).     Patient is a 41 year old female with history of anxiety, depression, GERD who presents with 2 days of left-sided chest pain.  Chest pain is nonexertional.  On exam patient has normal heart sounds and breath sounds.  No signs of DVT or peripheral edema.  No abdominal tenderness.  Patient is PERC negative for PE.  Patient has a hero score of 1.   Low suspicion for ACS given the history.  She does have frequent reflux so this may be GI related.    Plan for delta troponin to rule out ACS.  EKG shows normal sinus rhythm with a rate of 90 and no signs of ischemia.  Delta troponin is negative.  Other labs as above are normal.  Chest x-ray is unremarkable.  Her symptoms are likely GI or anxiety/stress related given her recent change in job.  Patient will be discharged with a prescription for omeprazole and check to the follow-up outpatient PCP.  Final Clinical Impressions(s) / ED Diagnoses   Final diagnoses:  Atypical chest pain    ED Discharge Orders        Ordered    omeprazole (PRILOSEC) 20 MG capsule  Daily     03 /31/19 1637       Dwana Melena, DO 08/04/17 1644    Blane Ohara, MD 08/05/17 [phone]

## 2017-08-04 NOTE — ED Notes (Signed)
Pt and family verbalized understanding of discharge instructions. Unable to sign , no computer available.

## 2017-09-25 ENCOUNTER — Encounter (HOSPITAL_BASED_OUTPATIENT_CLINIC_OR_DEPARTMENT_OTHER): Admission: RE | Payer: Self-pay | Source: Ambulatory Visit

## 2017-09-25 ENCOUNTER — Ambulatory Visit (HOSPITAL_BASED_OUTPATIENT_CLINIC_OR_DEPARTMENT_OTHER)
Admission: RE | Admit: 2017-09-25 | Payer: Commercial Managed Care - PPO | Source: Ambulatory Visit | Admitting: Plastic Surgery

## 2017-09-25 SURGERY — CAPSULECTOMY, BREAST, WITH REPLACEMENT OF IMPLANT
Anesthesia: General | Site: Breast | Laterality: Left

## 2018-07-17 IMAGING — CR DG CHEST 2V
2 series · 2 of 2 positions shown · non-contrast
Comparison: None.

CLINICAL DATA: Chest pain

EXAM:
CHEST - 2 VIEW

[chest lat]
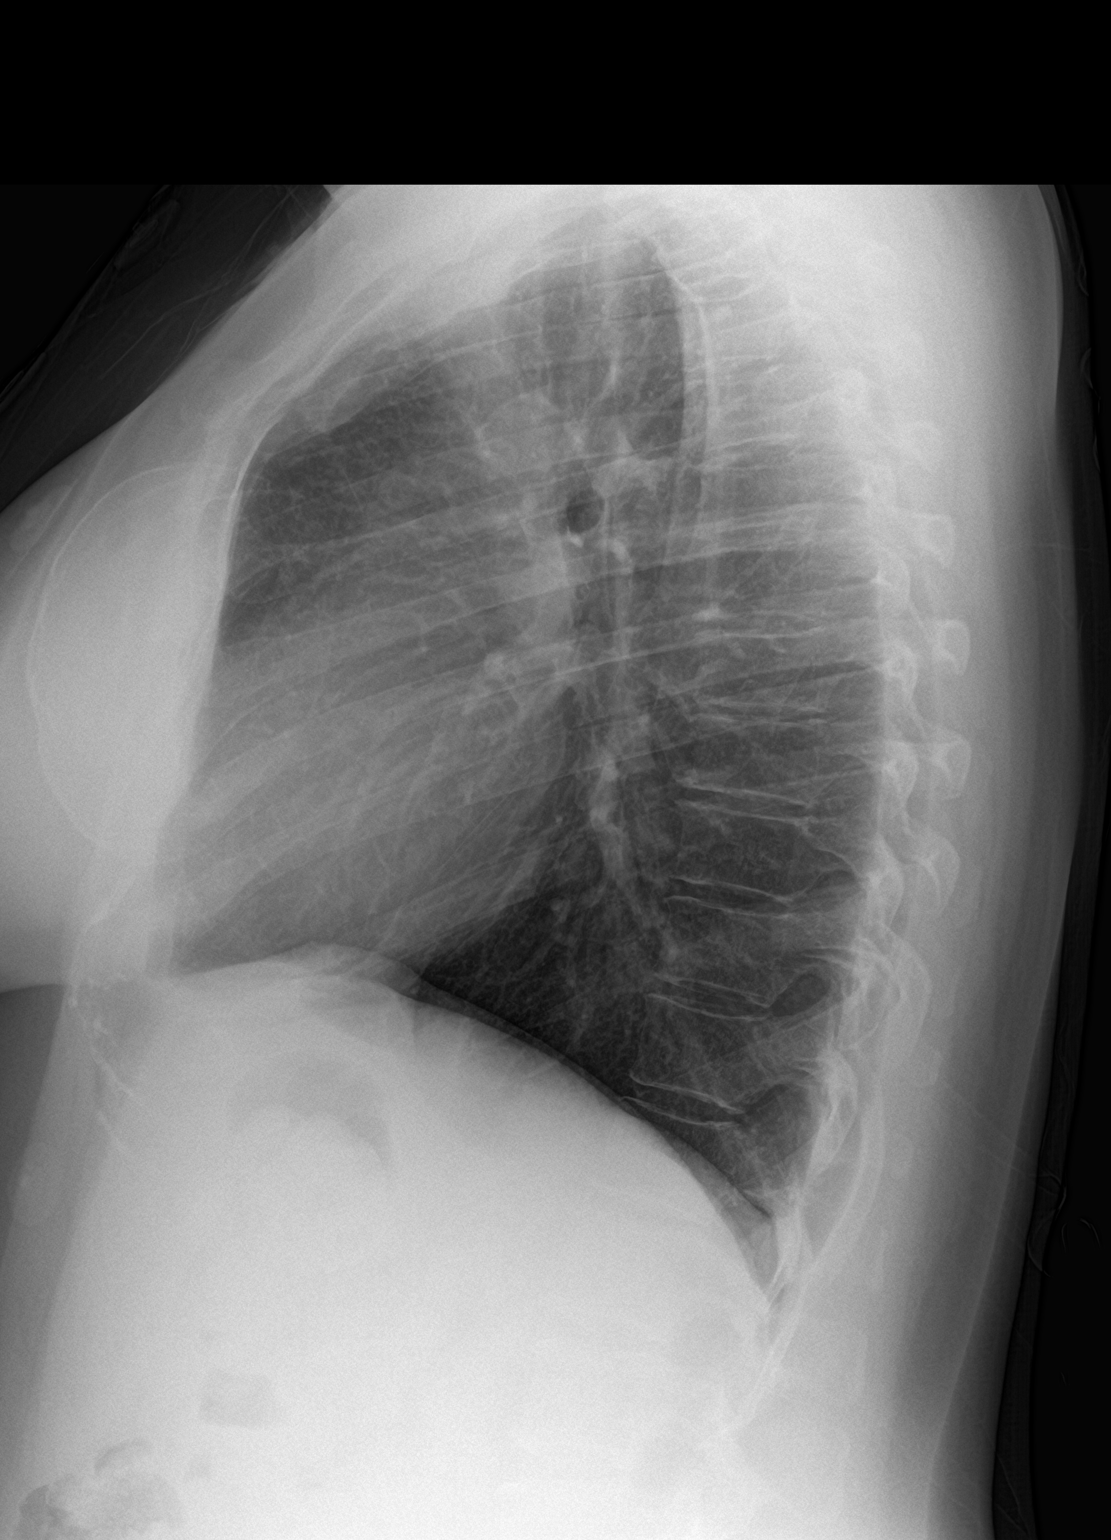

[chest pa]
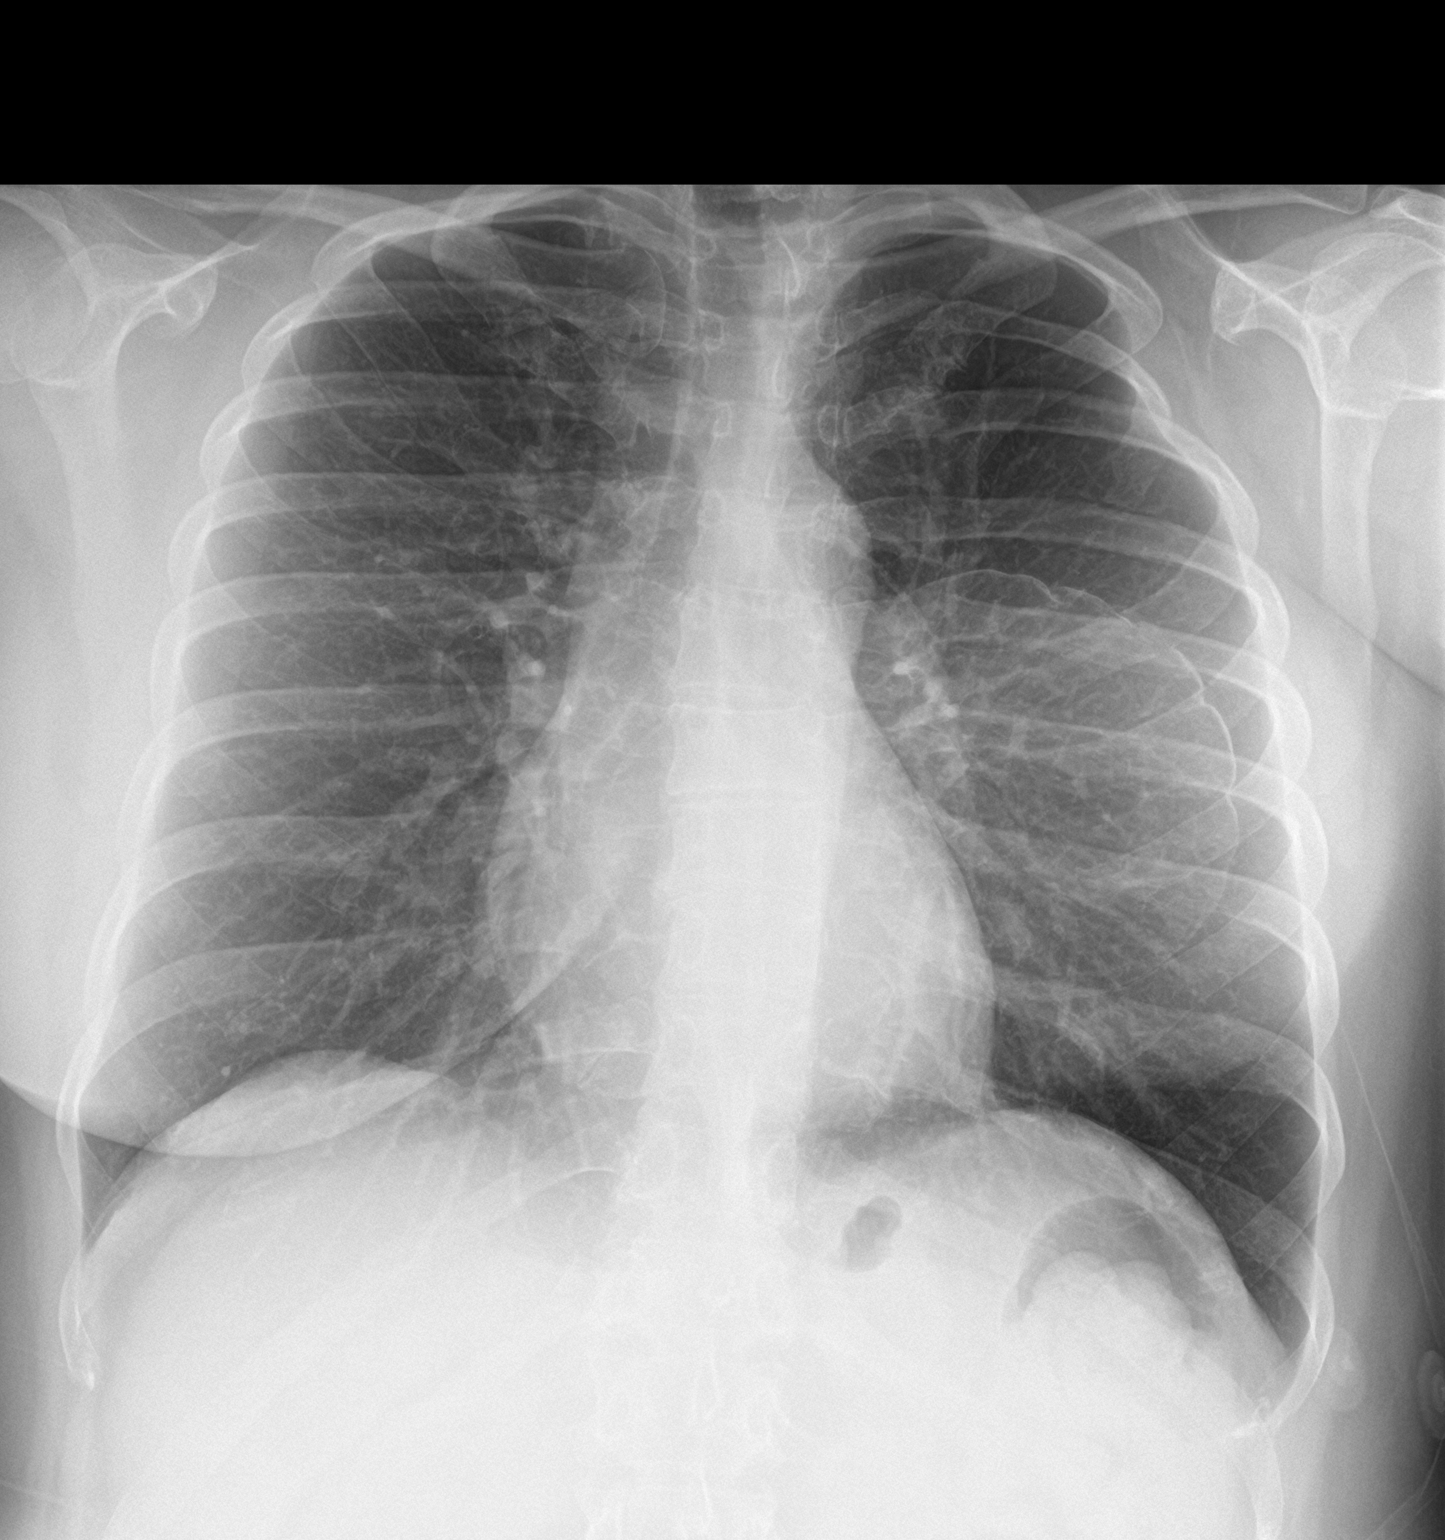

[2 of 2 positions shown; findings below may reference images not displayed]

FINDINGS: The heart size and mediastinal contours are within normal limits.
Both lungs are clear. The visualized skeletal structures are
unremarkable.
IMPRESSION: No active cardiopulmonary disease.
